# Patient Record
Sex: Male | Born: 1970
Health system: Southern US, Community
[De-identification: ages and names within clinical notes are randomized; demographics above are authoritative.]

## PROBLEM LIST (undated history)

## (undated) DIAGNOSIS — I10 Essential (primary) hypertension: Secondary | ICD-10-CM

## (undated) DIAGNOSIS — J069 Acute upper respiratory infection, unspecified: Secondary | ICD-10-CM

## (undated) DIAGNOSIS — R519 Headache, unspecified: Secondary | ICD-10-CM

## (undated) HISTORY — DX: Acute upper respiratory infection, unspecified: J06.9

## (undated) HISTORY — DX: Headache, unspecified: R51.9

## (undated) HISTORY — DX: Essential (primary) hypertension: I10

---

## 2001-11-02 HISTORY — PX: BACK SURGERY: SHX140

## 2006-11-12 ENCOUNTER — Emergency Department (HOSPITAL_COMMUNITY): Admission: EM | Admit: 2006-11-12 | Discharge: 2006-11-12 | Payer: Self-pay | Admitting: Emergency Medicine

## 2011-05-24 ENCOUNTER — Emergency Department (HOSPITAL_COMMUNITY): Payer: 59

## 2011-05-24 ENCOUNTER — Encounter: Payer: Self-pay | Admitting: *Deleted

## 2011-05-24 ENCOUNTER — Emergency Department (HOSPITAL_COMMUNITY)
Admission: EM | Admit: 2011-05-24 | Discharge: 2011-05-24 | Disposition: A | Discharge status: Home / Self Care | Payer: 59 | Attending: Emergency Medicine | Admitting: Emergency Medicine

## 2011-05-24 DIAGNOSIS — M25579 Pain in unspecified ankle and joints of unspecified foot: Secondary | ICD-10-CM | POA: Insufficient documentation

## 2011-05-24 DIAGNOSIS — F17200 Nicotine dependence, unspecified, uncomplicated: Secondary | ICD-10-CM | POA: Insufficient documentation

## 2011-05-24 DIAGNOSIS — M25571 Pain in right ankle and joints of right foot: Secondary | ICD-10-CM

## 2011-05-24 MED ORDER — HYDROCODONE-ACETAMINOPHEN 5-325 MG PO TABS
ORAL_TABLET | ORAL | Status: DC
Start: 2011-05-24 — End: 2011-05-26

## 2011-05-24 MED ORDER — IBUPROFEN 800 MG PO TABS
800.0000 mg | ORAL_TABLET | Freq: Once | ORAL | Status: AC
  Administered 2011-05-24: 800 mg via ORAL
  Filled 2011-05-24: qty 1

## 2011-05-24 MED ORDER — HYDROCODONE-ACETAMINOPHEN 5-325 MG PO TABS
1.0000 | ORAL_TABLET | Freq: Once | ORAL | Status: AC
  Administered 2011-05-24: 1 via ORAL
  Filled 2011-05-24: qty 1

## 2011-05-24 NOTE — ED Notes (Signed)
Pt states has cysts on right ankle.  Yesterday pt states pain became unbearable, states feels like someone poured gas on my foot.  Bruising/deformity noted to right ankle.  Denies new injury.  Applied heat/ice with elevation without relief.

## 2011-05-24 NOTE — ED Notes (Signed)
Pt ambulated with steady gate to POV. Wife to transport home.

## 2011-05-24 NOTE — ED Provider Notes (Signed)
History     Chief Complaint  Patient presents with  . Ankle Pain   Patient is a 40 y.o. male presenting with ankle pain. The history is provided by the patient. No language interpreter was used.  Ankle Pain  The incident occurred yesterday (pain since yest but has had "cysts" on ankle for 8-9 years.Marland Kitchen  dx per PCP in [place].  no known injury.). There was no injury mechanism. The pain is present in the right ankle. The quality of the pain is described as aching. The pain is moderate. He reports no foreign bodies present. The symptoms are aggravated by bearing weight and palpation. He has tried NSAIDs for the symptoms. The treatment provided no relief.    History reviewed. No pertinent past medical history.  History reviewed. No pertinent past surgical history.  No family history on file.  History  Substance Use Topics  . Smoking status: Current Everyday Smoker    Types: Cigarettes  . Smokeless tobacco: Not on file  . Alcohol Use: Yes     occasaional      Review of Systems  Musculoskeletal: Positive for gait problem.       Ankle pain  All other systems reviewed and are negative.    Physical Exam  BP 134/75  Pulse 102  Temp(Src) 99.4 F (37.4 C) (Oral)  Resp 16  Ht 6\' 2"  (1.88 m)  Wt 205 lb (92.987 kg)  BMI 26.32 kg/m2  SpO2 100%  Physical Exam  Nursing note and vitals reviewed. Constitutional: He is oriented to person, place, and time. Vital signs are normal. He appears well-developed and well-nourished.  HENT:  Head: Normocephalic and atraumatic.  Right Ear: External ear normal.  Left Ear: External ear normal.  Nose: Nose normal.  Mouth/Throat: No oropharyngeal exudate.  Eyes: Conjunctivae and EOM are normal. Pupils are equal, round, and reactive to light. Right eye exhibits no discharge. Left eye exhibits no discharge. No scleral icterus.  Neck: Normal range of motion. Neck supple. No JVD present. No tracheal deviation present. No thyromegaly present.    Cardiovascular: Normal rate, regular rhythm, normal heart sounds, intact distal pulses and normal pulses.  Exam reveals no gallop and no friction rub.   No murmur heard. Pulmonary/Chest: Effort normal and breath sounds normal. No stridor. No respiratory distress. He has no wheezes. He has no rales. He exhibits no tenderness.  Abdominal: Soft. Normal appearance and bowel sounds are normal. He exhibits no distension and no mass. There is no tenderness. There is no rebound and no guarding.  Musculoskeletal: He exhibits tenderness. He exhibits no edema.       Feet:  Lymphadenopathy:    He has no cervical adenopathy.  Neurological: He is alert and oriented to person, place, and time. He has normal reflexes. No cranial nerve deficit. Coordination normal. GCS eye subscore is 4. GCS verbal subscore is 5. GCS motor subscore is 6.  Reflex Scores:      Tricep reflexes are 2+ on the right side and 2+ on the left side.      Bicep reflexes are 2+ on the right side and 2+ on the left side.      Brachioradialis reflexes are 2+ on the right side and 2+ on the left side.      Patellar reflexes are 2+ on the right side and 2+ on the left side.      Achilles reflexes are 2+ on the right side and 2+ on the left side. Skin: Skin is warm  and dry. No rash noted. He is not diaphoretic.  Psychiatric: He has a normal mood and affect. His speech is normal and behavior is normal. Judgment and thought content normal. Cognition and memory are normal.    ED Course  Procedures  MDM       Worthy Rancher, PA 05/24/11 2036

## 2011-05-24 NOTE — ED Notes (Signed)
Pt a/ox4. Resp even and unlabored. NAD at this time. D/C instructions reviewed with pt and wife. Both verbalized understanidng.

## 2011-05-25 NOTE — ED Provider Notes (Signed)
Evaluation and management procedures were performed by the mid-level provider (PA/NP/CNM) under my supervision/collaboration.   Vida Roller, MD 05/25/11 [phone]

## 2011-05-26 ENCOUNTER — Encounter: Payer: Self-pay | Admitting: Orthopedic Surgery

## 2011-05-26 ENCOUNTER — Ambulatory Visit (INDEPENDENT_AMBULATORY_CARE_PROVIDER_SITE_OTHER): Payer: 59 | Admitting: Orthopedic Surgery

## 2011-05-26 VITALS — Resp 16 | Ht 74.0 in | Wt 205.0 lb

## 2011-05-26 DIAGNOSIS — M25579 Pain in unspecified ankle and joints of unspecified foot: Secondary | ICD-10-CM

## 2011-05-26 MED ORDER — HYDROCODONE-ACETAMINOPHEN 5-325 MG PO TABS: ORAL_TABLET | ORAL | Status: DC

## 2011-05-26 MED ORDER — IBUPROFEN 800 MG PO TABS: 800.0000 mg | ORAL_TABLET | Freq: Three times a day (TID) | ORAL | Status: AC | PRN

## 2011-05-26 NOTE — Progress Notes (Signed)
   New patient  Referral from the emergency room.  Chief complaint is pain, RIGHT ankle and foot.  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 He went to the emergency room complaining of sharp throbbing, burning, pain, 8-10/10, which is constant unremitting, associated with tingling, swelling, darkening of the skin over the lateral ankle and unrelieved by Norco 5 mg and 400 mg of of ibuprofen every 6-8 hours. Also, nonweightbearing with crutches still having constant pain.  He denies any trauma, no history of gout.  Review of Systems all were reported as negative.  Vital signs are stable as recorded  General appearance is normal  The patient is alert and oriented x3  The patient's mood and affect are normal  Gait assessment: crutches The cardiovascular exam reveals normal pulses and temperature without edema swelling.  The lymphatic system is negative for palpable lymph nodes  The sensory exam is normal.  There are no pathologic reflexes.  Balance is normal.   Exam of the right foot Inspection tenderness in the lateral ankle with swelling of the lateral ankle  Range of motion is PF = 20 and DF = 10  Stability normal  Strength N/T  Skin dark over 2 areas of prior skin callouses   Negative. xrays from the hospital   Ventral diagnosis gout, synovitis, rheumatoid arthritis.  Recommend lab series CBC, C-reactive protein, sedimentation rate uric acid. Continue nonweightbearing.

## 2011-05-26 NOTE — Patient Instructions (Signed)
Take Ibuprofen 800mg   1 tablet by mouth every 8 hrs  Take pain medicine every 6 hrs as needed  Go for labs today  No weightbearing  Come back in a week

## 2011-05-27 LAB — CBC WITH DIFFERENTIAL/PLATELET
Eosinophils Relative: 6 % — ABNORMAL HIGH (ref 0–5)
HCT: 46.8 % (ref 39.0–52.0)
Lymphocytes Relative: 35 % (ref 12–46)
Lymphs Abs: 2.7 10*3/uL (ref 0.7–4.0)
MCV: 91.6 fL (ref 78.0–100.0)
Monocytes Absolute: 0.5 10*3/uL (ref 0.1–1.0)
Monocytes Relative: 7 % (ref 3–12)
RBC: 5.11 MIL/uL (ref 4.22–5.81)
WBC: 7.7 10*3/uL (ref 4.0–10.5)

## 2011-05-28 ENCOUNTER — Ambulatory Visit: Payer: 59 | Admitting: Orthopedic Surgery

## 2011-05-28 ENCOUNTER — Telehealth: Payer: Self-pay | Admitting: *Deleted

## 2011-05-28 NOTE — Telephone Encounter (Signed)
Called patient and advised labs were normal keep scheduled appt no weightbearing, RICE and meds.

## 2011-06-02 ENCOUNTER — Ambulatory Visit: Payer: 59 | Admitting: Orthopedic Surgery

## 2011-11-17 ENCOUNTER — Ambulatory Visit (INDEPENDENT_AMBULATORY_CARE_PROVIDER_SITE_OTHER): Payer: 59 | Admitting: Cardiology

## 2011-11-17 ENCOUNTER — Encounter: Payer: Self-pay | Admitting: Cardiology

## 2011-11-17 ENCOUNTER — Encounter: Payer: Self-pay | Admitting: *Deleted

## 2011-11-17 ENCOUNTER — Other Ambulatory Visit: Payer: Self-pay | Admitting: Cardiology

## 2011-11-17 VITALS — BP 132/89 | HR 70 | Ht 74.0 in | Wt 207.0 lb

## 2011-11-17 DIAGNOSIS — F17200 Nicotine dependence, unspecified, uncomplicated: Secondary | ICD-10-CM

## 2011-11-17 DIAGNOSIS — Z01812 Encounter for preprocedural laboratory examination: Secondary | ICD-10-CM

## 2011-11-17 DIAGNOSIS — IMO0001 Reserved for inherently not codable concepts without codable children: Secondary | ICD-10-CM | POA: Insufficient documentation

## 2011-11-17 DIAGNOSIS — R030 Elevated blood-pressure reading, without diagnosis of hypertension: Secondary | ICD-10-CM

## 2011-11-17 DIAGNOSIS — I200 Unstable angina: Secondary | ICD-10-CM

## 2011-11-17 DIAGNOSIS — Z720 Tobacco use: Secondary | ICD-10-CM | POA: Insufficient documentation

## 2011-11-17 DIAGNOSIS — Z8249 Family history of ischemic heart disease and other diseases of the circulatory system: Secondary | ICD-10-CM

## 2011-11-17 DIAGNOSIS — R072 Precordial pain: Secondary | ICD-10-CM | POA: Insufficient documentation

## 2011-11-17 NOTE — Progress Notes (Signed)
   Clinical Summary Mr. Crandall is a 41 y.o.male referred by Dr. Burdine for evaluation of chest pain. He reports at least a two-month history of left arm discomfort described as a dull ache radiating from shoulder to wrist, sporadic, usually notices it after the end of a busy day. Within the last few weeks he has been experiencing more frank chest discomfort, initially sporadic, and now more specifically with exertion. He describes a "pressure" and also a vague dullness that never seems to abate.  Recent ED visit at [facility] on 1/1 with same symptoms noted studies showing Hgb 15.8, platelets 228, creatinine 0.8, potassium 3.5, troponin I normal, CKMB normal, chest x-ray without acute process. He has been seen in the office by Dr. Kirk Burdine subsequently, and there was discussion about perhaps pursuing stress testing.  He reports that his symptoms are progressing, and he remains very concerned about the status of his heart, in light of significant family history of premature CAD. He also states his father had a thoracic aneurysm in addition to CAD. He developed heart problems in his 39s.  He reports no major medical conditions or hospitalizations. Blood pressure has been elevated on the last few visits that I was able to review, however he has no long-standing history of hypertension. Lipid status uncertain.  Today we discussed options for further cardiac evaluation including both noninvasive and invasive techniques. After a thorough review of the options including risks and benefits, he is in agreement to proceed with a diagnostic cardiac catheterization to assess coronary anatomy, and also an aortic root injection to exclude aneurysmal change.   No Known Allergies  Current Outpatient Prescriptions  Medication Sig Dispense Refill  . ibuprofen (ADVIL,MOTRIN) 200 MG tablet Take 200 mg by mouth every 6 (six) hours as needed. 2 tablets as needed         No past medical history on file.  Past  Surgical History  Procedure Date  . Back surgery 2003    Family History  Problem Relation Age of Onset  . Heart disease Maternal Grandfather 70    Deceased  . Diabetes    . Heart disease Paternal Grandmother 70    Deceased  . Heart attack Father 45    Deceased  . Hypertension Sister     Alive  . Aneurysm Father     Social History Mr. Caffey reports that he has been smoking Cigarettes.  He does not have any smokeless tobacco history on file. Mr. Rease reports that he drinks alcohol.  Review of Systems No palpitations or syncope. No claudication. No cough, fevers or chills. No reported bleeding problems. Appetite stable. Otherwise negative.  Physical Examination Filed Vitals:   11/17/11 1019  BP: 132/89  Pulse: 70   Well-developed male in no acute distress. HEENT: Conjunctiva and lids normal, oropharynx clear with moist mucosa. Neck: Supple, no elevated JVP or carotid bruits, no thyromegaly. Lungs: Clear to auscultation, nonlabored breathing at rest. Cardiac: Regular rate and rhythm, no S3 or significant systolic murmur, no pericardial rub. Abdomen: Soft, nontender, bowel sounds present, no guarding or rebound. Extremities: No pitting edema, distal pulses 2+. Skin: Warm and dry. Musculoskeletal: No kyphosis. Neuropsychiatric: Alert and oriented x3, affect grossly appropriate.  ECG Normal sinus rhythm.   Problem List and Plan   

## 2011-11-17 NOTE — Progress Notes (Signed)
Addended by: Reather Laurence A on: 11/17/2011 11:15 AM   Modules accepted: Orders

## 2011-11-17 NOTE — Assessment & Plan Note (Signed)
As noted above, requesting aortic root injection with cardiac catheterization.

## 2011-11-17 NOTE — Assessment & Plan Note (Signed)
We discussed smoking cessation today. He states he was able to quit for 3 months last year. He seems to be reasonably motivated for another attempt.

## 2011-11-17 NOTE — Assessment & Plan Note (Signed)
Noted today, as well as at ER visit. We discussed this, and I have recommended closer followup for blood pressure. He will need regular follow up with Dr. Leandrew Koyanagi, and possibly medical therapy, particularly if his cardiac catheterization results are abnormal.

## 2011-11-17 NOTE — Assessment & Plan Note (Signed)
Chest pain symptoms that are concerning for unstable angina, however with both typical and atypical features, noted within the last few weeks, preceded by left arm discomfort. Resting ECG is normal. Principal risk includes gender and significant family history of premature CAD. He is also noted to be hypertensive with no standing history of same. Lipid status uncertain at this point. In light of his progressive symptoms, and need for a clear diagnosis, we have reviewed the risks and benefits of diagnostic cardiac catheterization. This is being arranged as an outpatient. He will be initiated on aspirin, may require further therapies depending on the results. Can also assess lipid status along the way. I am also requesting an aortic root injection to exclude possible aortic aneurysmal disease in light of his father's history and chest pain symptoms.

## 2011-11-17 NOTE — Patient Instructions (Signed)
Your physician recommends that you schedule a follow-up appointment in: 3 months  Your physician has recommended you make the following change in your medication:  START Aspirin 81 mg daily  Your physician has requested that you have a cardiac catheterization. Cardiac catheterization is used to diagnose and/or treat various heart conditions. Doctors may recommend this procedure for a number of different reasons. The most common reason is to evaluate chest pain. Chest pain can be a symptom of coronary artery disease (CAD), and cardiac catheterization can show whether plaque is narrowing or blocking your heart's arteries. This procedure is also used to evaluate the valves, as well as measure the blood flow and oxygen levels in different parts of your heart. For further information please visit [email or web address] Please follow instruction sheet, as given.  Your physician recommends that you return for lab work in: Today

## 2011-11-18 LAB — CBC
HCT: 48.2 % (ref 39.0–52.0)
MCV: 93.8 fL (ref 78.0–100.0)
Platelets: 239 10*3/uL (ref 150–400)
RBC: 5.14 MIL/uL (ref 4.22–5.81)
WBC: 7.2 10*3/uL (ref 4.0–10.5)

## 2011-11-18 LAB — COMPREHENSIVE METABOLIC PANEL
ALT: 15 U/L (ref 0–53)
CO2: 28 mEq/L (ref 19–32)
Calcium: 10 mg/dL (ref 8.4–10.5)
Chloride: 105 mEq/L (ref 96–112)
Creat: 1 mg/dL (ref 0.50–1.35)

## 2011-11-20 ENCOUNTER — Inpatient Hospital Stay (HOSPITAL_BASED_OUTPATIENT_CLINIC_OR_DEPARTMENT_OTHER)
Admission: RE | Admit: 2011-11-20 | Discharge: 2011-11-20 | Disposition: A | Discharge status: Home / Self Care | Payer: 59 | Source: Ambulatory Visit | Attending: Cardiovascular Disease | Admitting: Cardiovascular Disease

## 2011-11-20 ENCOUNTER — Inpatient Hospital Stay (HOSPITAL_BASED_OUTPATIENT_CLINIC_OR_DEPARTMENT_OTHER): Admission: RE | Admit: 2011-11-20 | Payer: 59 | Source: Ambulatory Visit | Admitting: Cardiovascular Disease

## 2011-11-20 ENCOUNTER — Inpatient Hospital Stay (HOSPITAL_BASED_OUTPATIENT_CLINIC_OR_DEPARTMENT_OTHER): Admit: 2011-11-20 | Payer: Self-pay | Admitting: Cardiovascular Disease

## 2011-11-20 ENCOUNTER — Encounter (HOSPITAL_BASED_OUTPATIENT_CLINIC_OR_DEPARTMENT_OTHER): Admission: RE | Payer: Self-pay | Source: Ambulatory Visit

## 2011-11-20 ENCOUNTER — Encounter (HOSPITAL_BASED_OUTPATIENT_CLINIC_OR_DEPARTMENT_OTHER)
Admission: RE | Disposition: A | Discharge status: Home / Self Care | Payer: Self-pay | Source: Ambulatory Visit | Attending: Cardiovascular Disease

## 2011-11-20 ENCOUNTER — Encounter (HOSPITAL_BASED_OUTPATIENT_CLINIC_OR_DEPARTMENT_OTHER): Payer: Self-pay

## 2011-11-20 DIAGNOSIS — Z8249 Family history of ischemic heart disease and other diseases of the circulatory system: Secondary | ICD-10-CM | POA: Insufficient documentation

## 2011-11-20 DIAGNOSIS — R0789 Other chest pain: Secondary | ICD-10-CM | POA: Insufficient documentation

## 2011-11-20 DIAGNOSIS — I200 Unstable angina: Secondary | ICD-10-CM

## 2011-11-20 DIAGNOSIS — R079 Chest pain, unspecified: Secondary | ICD-10-CM

## 2011-11-20 SURGERY — JV LEFT HEART CATHETERIZATION WITH CORONARY ANGIOGRAM
Anesthesia: Moderate Sedation

## 2011-11-20 MED ORDER — SODIUM CHLORIDE 0.9 % IV SOLN: INTRAVENOUS | Status: AC

## 2011-11-20 MED ORDER — ONDANSETRON HCL 4 MG/2ML IJ SOLN: 4.0000 mg | Freq: Four times a day (QID) | INTRAMUSCULAR | Status: DC | PRN

## 2011-11-20 MED ORDER — ACETAMINOPHEN 325 MG PO TABS: 650.0000 mg | ORAL_TABLET | ORAL | Status: DC | PRN

## 2011-11-20 MED ORDER — ROSUVASTATIN CALCIUM 40 MG PO TABS: 40.0000 mg | ORAL_TABLET | ORAL | Status: DC

## 2011-11-20 MED ORDER — ASPIRIN 81 MG PO CHEW
324.0000 mg | CHEWABLE_TABLET | ORAL | Status: AC
  Administered 2011-11-20: 324 mg via ORAL

## 2011-11-20 MED ORDER — DIAZEPAM 5 MG PO TABS
5.0000 mg | ORAL_TABLET | ORAL | Status: AC
  Administered 2011-11-20: 5 mg via ORAL

## 2011-11-20 MED ORDER — SODIUM CHLORIDE 0.9 % IV SOLN
INTRAVENOUS | Status: DC
  Administered 2011-11-20: 07:00:00 via INTRAVENOUS

## 2011-11-20 MED ORDER — ROSUVASTATIN CALCIUM 40 MG PO TABS: 40.0000 mg | ORAL_TABLET | Freq: Every day | ORAL | Status: DC

## 2011-11-20 NOTE — OR Nursing (Signed)
Tegaderm dressing applied, site level 0, bedrest begins at 0835 

## 2011-11-20 NOTE — Interval H&P Note (Signed)
History and Physical Interval Note:  11/20/2011 7:26 AM  John Matthews  has presented today for cardiac cath.  with the diagnosis of cp  The various methods of treatment have been discussed with the patient and family. After consideration of risks, benefits and other options for treatment, the patient has consented to  Procedure(s): JV LEFT HEART CATHETERIZATION WITH CORONARY ANGIOGRAM as a surgical intervention .  The patients' history has been reviewed, patient examined, no change in status, stable for surgery.  I have reviewed the patients' chart and labs.  Questions were answered to the patient's satisfaction.     Kaleia Longhi,[place]

## 2011-11-20 NOTE — Op Note (Signed)
Cardiac Catheterization Operative Report  John Matthews [id number] 1/18/20138:16 AM Juliette Alcide, MD, MD  Procedure Performed:  1. Left Heart Catheterization 2. Selective Coronary Angiography 3. Left ventricular angiogram 4. Aortic root angiogram  Operator: Verne Carrow, MD  Indication:   Chest pain   Referring: Nona Dell, MD                                    Procedure Details: The risks, benefits, complications, treatment options, and expected outcomes were discussed with the patient. The patient and/or family concurred with the proposed plan, giving informed consent. The patient was brought to the cath lab after IV hydration was begun and oral premedication was given. The patient was further sedated with Versed and Fentanyl. The right groin was prepped and draped in the usual manner. Using the modified Seldinger access technique, a 4 French sheath was placed in the right femoral artery. Standard diagnostic catheters were used to perform selective coronary angiography. A pigtail catheter was used to perform a left ventricular angiogram and an aortic root angiogram.   There were no immediate complications. The patient was taken to the recovery area in stable condition.   Hemodynamic Findings: Central aortic pressure: 81/60 Left ventricular pressure: 81/8/10  Angiographic Findings:  Left main: No evidence of disease.   Left Anterior Descending Artery: No angiographic evidence of disease. Large vessel that courses to the apex.   Circumflex Artery: No angiographic evidence of disease. Large vessel with moderate sized OM branch.   Right Coronary Artery: Large dominant vessel with no angiographic evidence of disease.   Left Ventricular Angiogram:Normal LV systolic function, LVEF 60%.   Aortic Root Angiogram: No dilatation.    Impression: 1. No angiographic evidence of CAD 2. Normal LV systolic function. 3. Non-cardiac etiology of chest  pain  Recommendations: No further cardiac workup.        Complications:  None. The patient tolerated the procedure well.

## 2011-11-20 NOTE — H&P (View-Only) (Signed)
   Clinical Summary John Matthews is a 42 y.o.male referred by Dr. Leandrew Koyanagi for evaluation of chest pain. He reports at least a two-month history of left arm discomfort described as a dull ache radiating from shoulder to wrist, sporadic, usually notices it after the end of a busy day. Within the last few weeks he has been experiencing more frank chest discomfort, initially sporadic, and now more specifically with exertion. He describes a "pressure" and also a vague dullness that never seems to abate.  Recent ED visit at [facility] on 1/1 with same symptoms noted studies showing Hgb 15.8, platelets 228, creatinine 0.8, potassium 3.5, troponin I normal, CKMB normal, chest x-ray without acute process. He has been seen in the office by Dr. Clydene Pugh subsequently, and there was discussion about perhaps pursuing stress testing.  He reports that his symptoms are progressing, and he remains very concerned about the status of his heart, in light of significant family history of premature CAD. He also states his father had a thoracic aneurysm in addition to CAD. He developed heart problems in his mid 47s.  He reports no major medical conditions or hospitalizations. Blood pressure has been elevated on the last few visits that I was able to review, however he has no long-standing history of hypertension. Lipid status uncertain.  Today we discussed options for further cardiac evaluation including both noninvasive and invasive techniques. After a thorough review of the options including risks and benefits, he is in agreement to proceed with a diagnostic cardiac catheterization to assess coronary anatomy, and also an aortic root injection to exclude aneurysmal change.   No Known Allergies  Current Outpatient Prescriptions  Medication Sig Dispense Refill  . ibuprofen (ADVIL,MOTRIN) 200 MG tablet Take 200 mg by mouth every 6 (six) hours as needed. 2 tablets as needed         No past medical history on file.  Past  Surgical History  Procedure Date  . Back surgery 2003    Family History  Problem Relation Age of Onset  . Heart disease Maternal Grandfather 24    Deceased  . Diabetes    . Heart disease Paternal Grandmother 14    Deceased  . Heart attack Father 81    Deceased  . Hypertension Sister     Alive  . Aneurysm Father     Social History John Matthews reports that he has been smoking Cigarettes.  He does not have any smokeless tobacco history on file. John Matthews reports that he drinks alcohol.  Review of Systems No palpitations or syncope. No claudication. No cough, fevers or chills. No reported bleeding problems. Appetite stable. Otherwise negative.  Physical Examination Filed Vitals:   11/17/11 1019  BP: 132/89  Pulse: 70   Well-developed male in no acute distress. HEENT: Conjunctiva and lids normal, oropharynx clear with moist mucosa. Neck: Supple, no elevated JVP or carotid bruits, no thyromegaly. Lungs: Clear to auscultation, nonlabored breathing at rest. Cardiac: Regular rate and rhythm, no S3 or significant systolic murmur, no pericardial rub. Abdomen: Soft, nontender, bowel sounds present, no guarding or rebound. Extremities: No pitting edema, distal pulses 2+. Skin: Warm and dry. Musculoskeletal: No kyphosis. Neuropsychiatric: Alert and oriented x3, affect grossly appropriate.  ECG Normal sinus rhythm.   Problem List and Plan

## 2011-11-20 NOTE — OR Nursing (Signed)
Meal offered, pt declined

## 2011-11-20 NOTE — OR Nursing (Signed)
Discharge instructions reviewed and signed, pt stated understanding, ambulated in hall without difficulty, site level 0, transported to wife's car via wheelchair 

## 2011-12-09 ENCOUNTER — Ambulatory Visit: Payer: 59 | Admitting: Cardiology

## 2011-12-10 ENCOUNTER — Encounter: Payer: Self-pay | Admitting: Cardiology

## 2011-12-10 ENCOUNTER — Ambulatory Visit (INDEPENDENT_AMBULATORY_CARE_PROVIDER_SITE_OTHER): Payer: 59 | Admitting: Cardiology

## 2011-12-10 VITALS — BP 133/85 | HR 86 | Ht 74.0 in | Wt 207.0 lb

## 2011-12-10 DIAGNOSIS — R072 Precordial pain: Secondary | ICD-10-CM

## 2011-12-10 DIAGNOSIS — R030 Elevated blood-pressure reading, without diagnosis of hypertension: Secondary | ICD-10-CM

## 2011-12-10 DIAGNOSIS — IMO0001 Reserved for inherently not codable concepts without codable children: Secondary | ICD-10-CM

## 2011-12-10 NOTE — Progress Notes (Signed)
   Clinical Summary John Matthews is a 41 y.o.male presenting for followup. He was referred for diagnostic cardiac catheterization following his initial visit secondary to chest pain symptoms and significant family history of premature cardiovascular disease as well as thoracic aortic aneurysm in his father.  Fortunately, he had no evidence of coronary artery disease with normal aortic arch. I reviewed these results with him today.  He continues to report a fairly constant feeling of chest discomfort, seems to now feel that it corresponds with psychosocial stressors in his life. I discussed this with him today. He was however reassured by the findings of his recent angiogram.  We discussed basic risk factor modification strategies going forward, stress reduction, and plan for him to follow up with his primary care provider.   No Known Allergies  Current Outpatient Prescriptions  Medication Sig Dispense Refill  . ibuprofen (ADVIL,MOTRIN) 200 MG tablet Take 200 mg by mouth every 6 (six) hours as needed. 2 tablets as needed         Social History John Matthews reports that he has been smoking Cigarettes.  He does not have any smokeless tobacco history on file. John Matthews reports that he drinks alcohol.  Review of Systems Otherwise negative except as outlined.  Physical Examination Filed Vitals:   12/10/11 1536  BP: 133/85  Pulse: 86   Well-developed male in no acute distress.  HEENT: Conjunctiva and lids normal, oropharynx clear with moist mucosa.  Neck: Supple, no elevated JVP or carotid bruits, no thyromegaly.  Lungs: Clear to auscultation, nonlabored breathing at rest.  Cardiac: Regular rate and rhythm, no S3 or significant systolic murmur, no pericardial rub.  Abdomen: Soft, nontender, bowel sounds present, no guarding or rebound.  Extremities: No pitting edema, distal pulses 2+.     Problem List and Plan

## 2011-12-10 NOTE — Assessment & Plan Note (Signed)
Recent cardiac catheterization was quite reassuring, no significant CAD with normal aortic arch. Patient was reassured. We did discuss stress reduction and basic risk factor modification. He will followup with his primary care physician.

## 2011-12-10 NOTE — Patient Instructions (Signed)
Your physician recommends that you continue on your current medications as directed. Please refer to the Current Medication list given to you today.  Your physician recommends that you schedule a follow-up appointment in: as needd

## 2011-12-10 NOTE — Assessment & Plan Note (Signed)
Recommended that he follow blood pressure carefully with his primary care physician, also diet and exercise.

## 2012-04-13 IMAGING — CR DG ANKLE COMPLETE 3+V*R*
3 series · 3 of 3 positions shown · non-contrast
Comparison: None.

CLINICAL DATA: Swelling and pain

RIGHT ANKLE - COMPLETE 3+ VIEW

[view not recorded (1 of 3)]
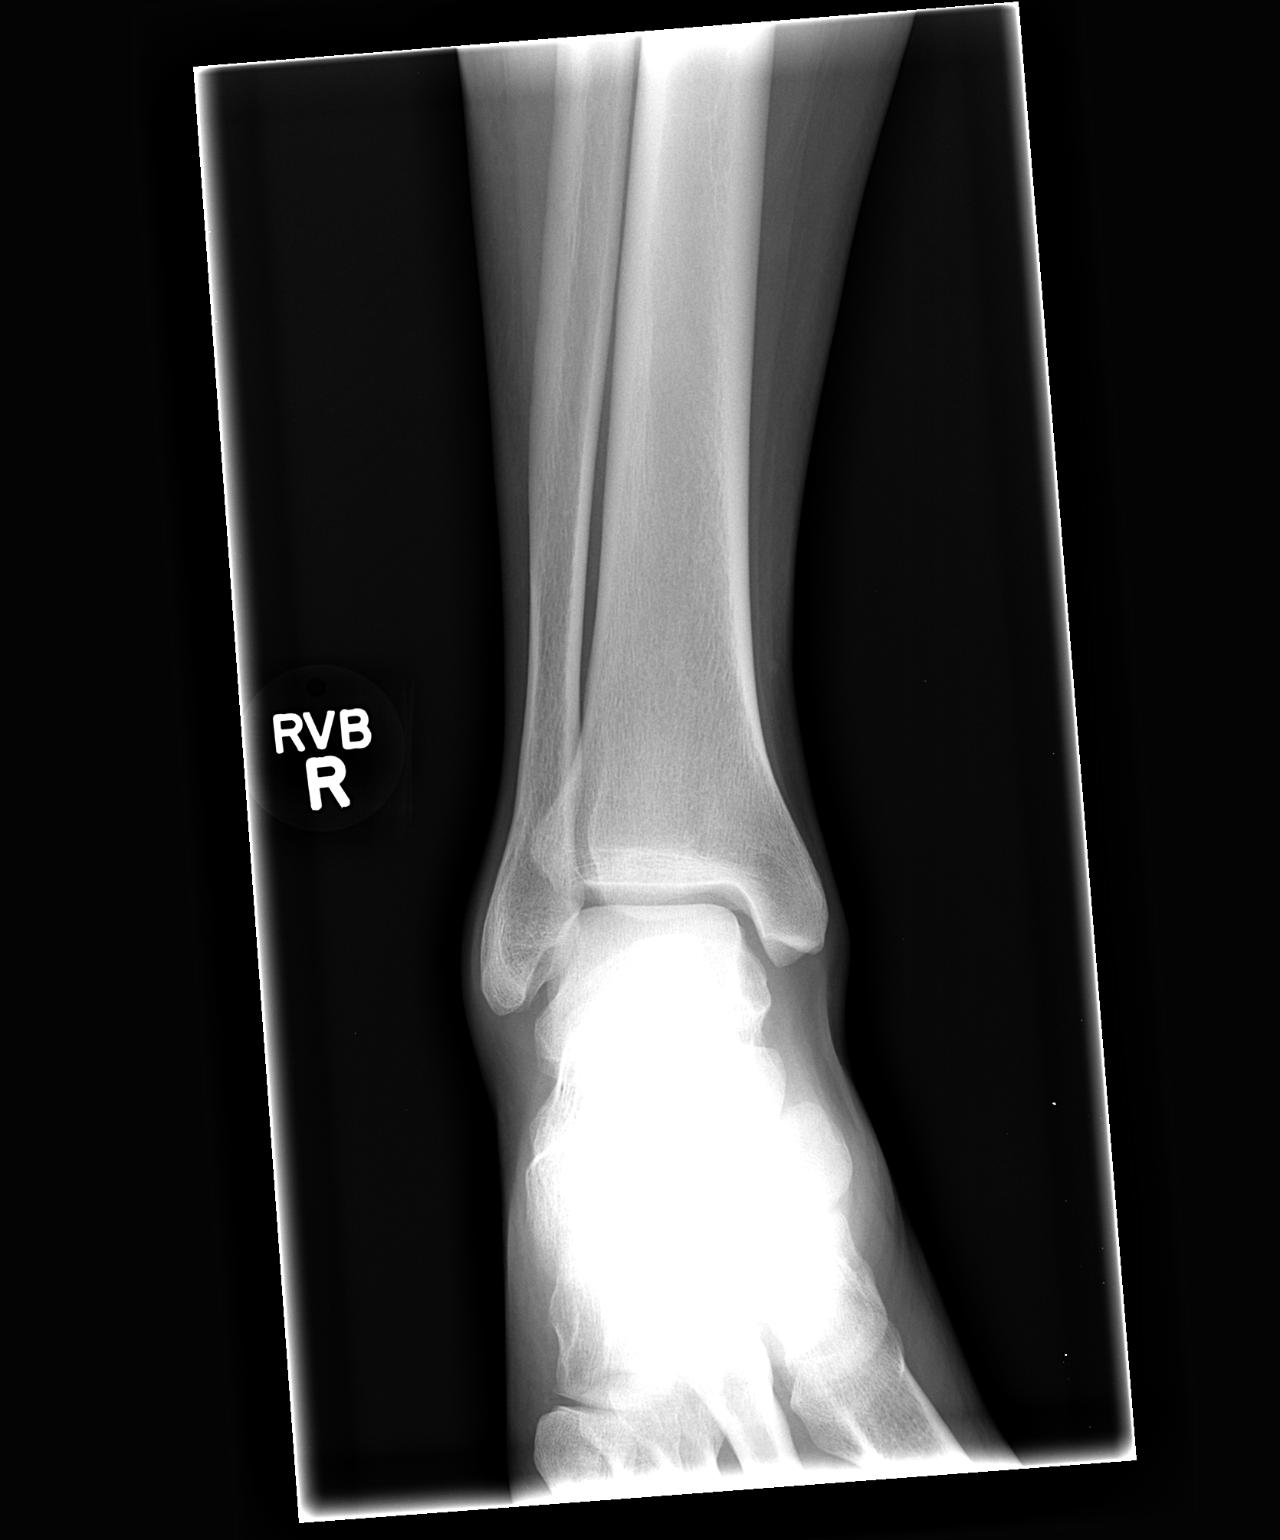

[view not recorded (2 of 3)]
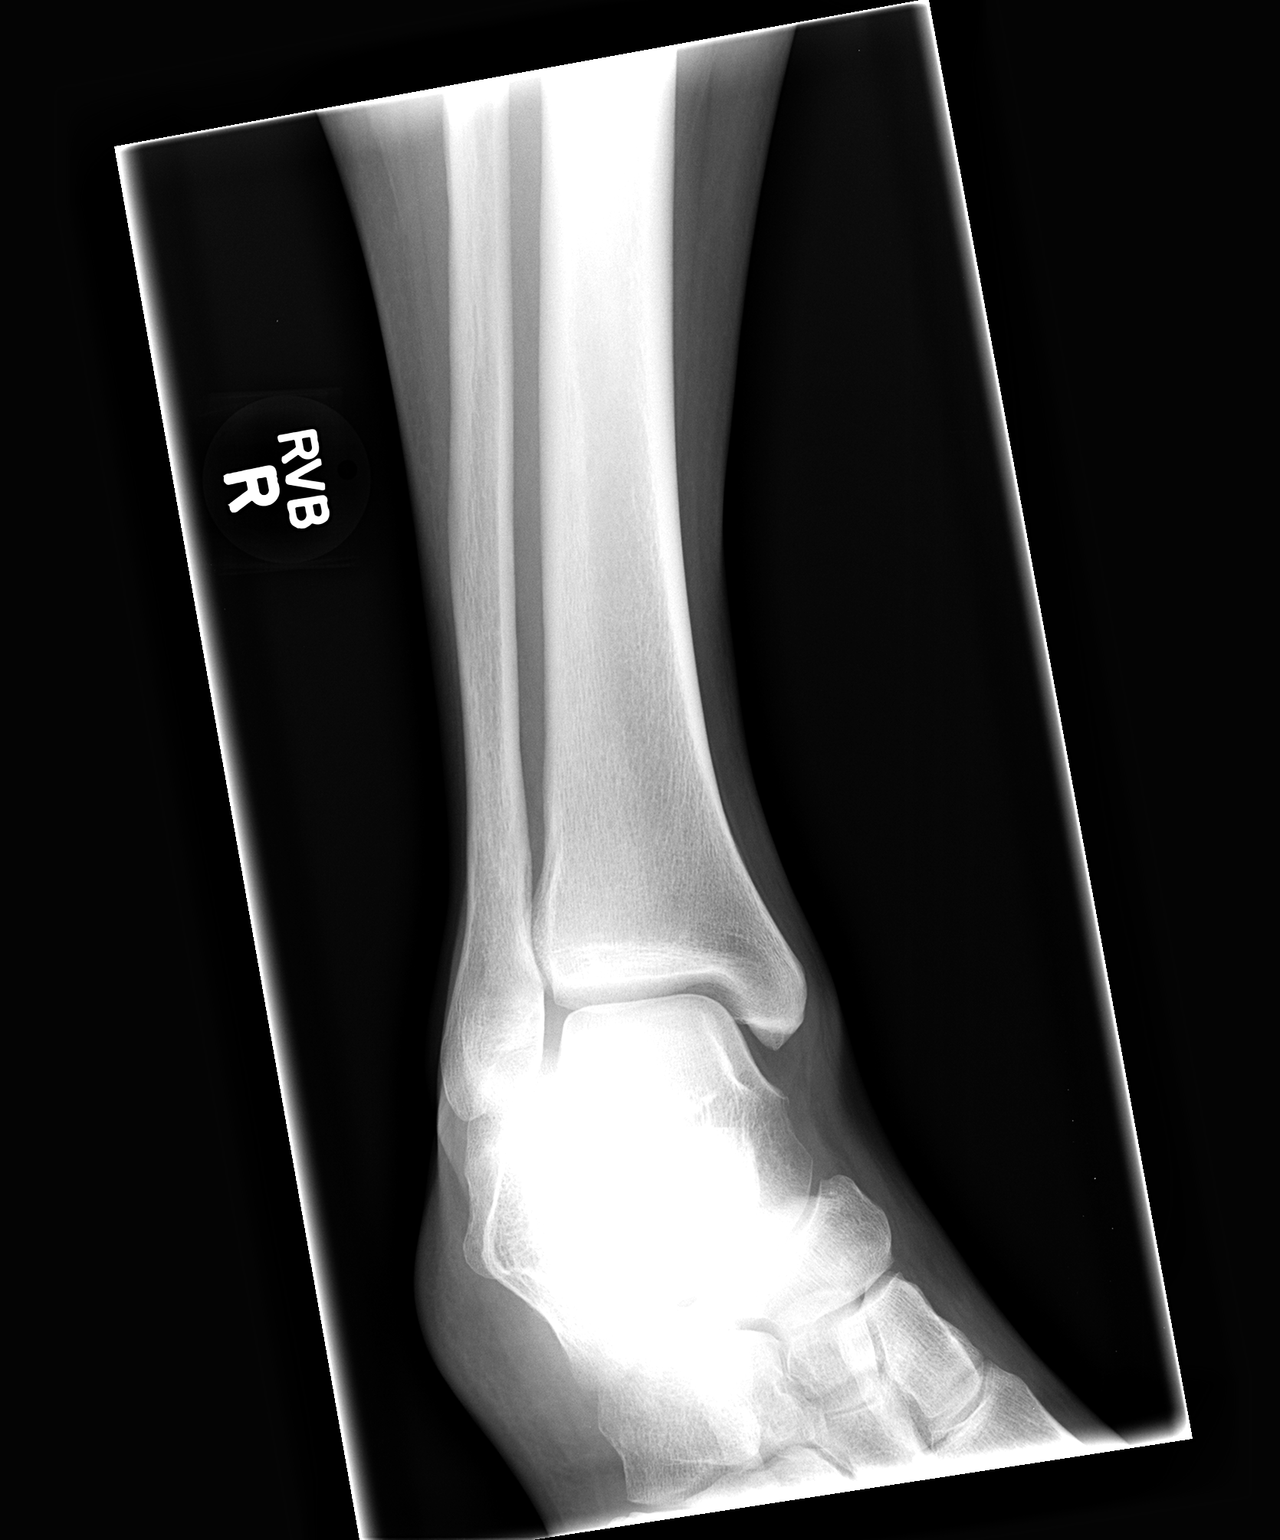

[view not recorded (3 of 3)]
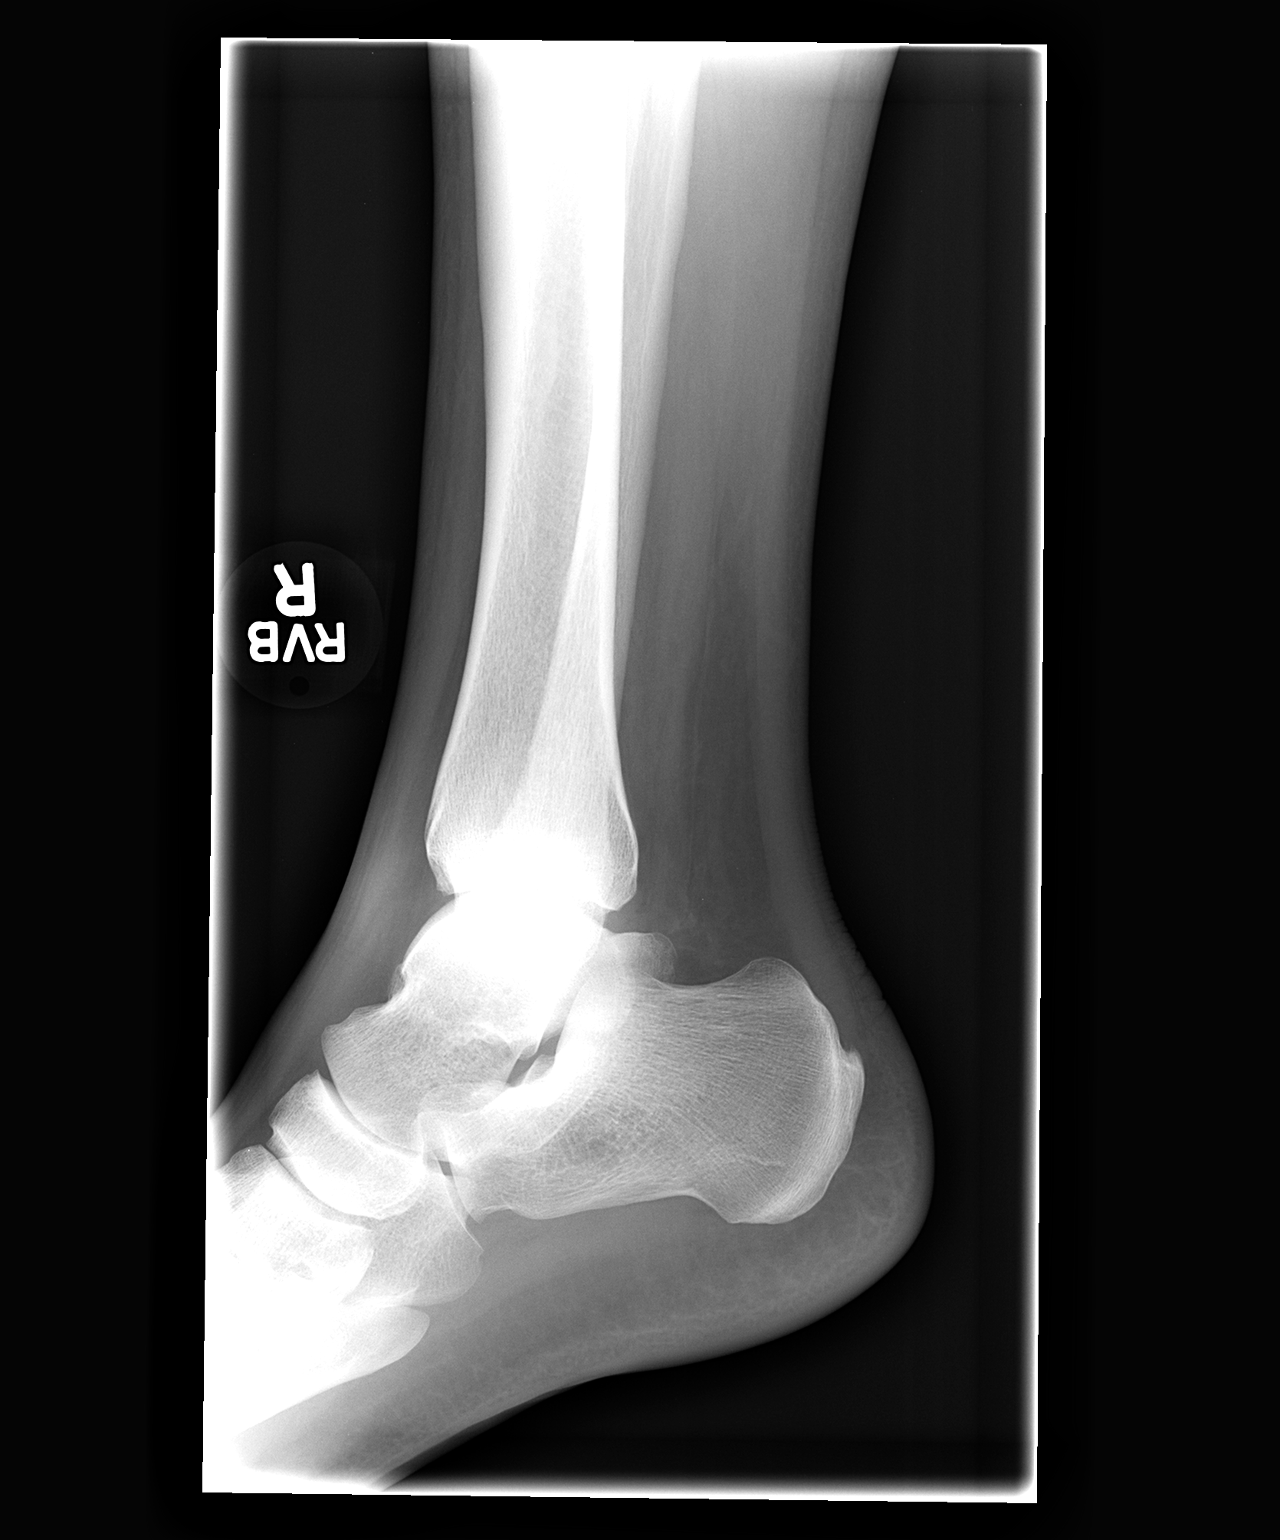

[3 of 3 positions shown; findings below may reference images not displayed]

FINDINGS: Ankle mortise intact. Negative for fracture, dislocation,
or other acute abnormality.  Normal alignment and mineralization.
No significant degenerative change.  Regional soft tissues
unremarkable.
IMPRESSION: Negative

## 2015-11-02 ENCOUNTER — Encounter (HOSPITAL_COMMUNITY): Payer: Self-pay | Admitting: Emergency Medicine

## 2015-11-02 ENCOUNTER — Emergency Department (HOSPITAL_COMMUNITY)
Admission: EM | Admit: 2015-11-02 | Discharge: 2015-11-02 | Disposition: A | Discharge status: Home / Self Care | Payer: 59 | Attending: Emergency Medicine | Admitting: Emergency Medicine

## 2015-11-02 DIAGNOSIS — R52 Pain, unspecified: Secondary | ICD-10-CM | POA: Diagnosis present

## 2015-11-02 DIAGNOSIS — J111 Influenza due to unidentified influenza virus with other respiratory manifestations: Secondary | ICD-10-CM | POA: Diagnosis not present

## 2015-11-02 DIAGNOSIS — F17210 Nicotine dependence, cigarettes, uncomplicated: Secondary | ICD-10-CM | POA: Insufficient documentation

## 2015-11-02 MED ORDER — HYDROCODONE-HOMATROPINE 5-1.5 MG/5ML PO SYRP: 5.0000 mL | ORAL_SOLUTION | Freq: Four times a day (QID) | ORAL | Status: DC | PRN

## 2015-11-02 MED ORDER — AMOXICILLIN 500 MG PO CAPS: 500.0000 mg | ORAL_CAPSULE | Freq: Three times a day (TID) | ORAL | Status: DC

## 2015-11-02 MED ORDER — LORATADINE-PSEUDOEPHEDRINE ER 5-120 MG PO TB12: 1.0000 | ORAL_TABLET | Freq: Two times a day (BID) | ORAL | Status: DC

## 2015-11-02 NOTE — ED Provider Notes (Signed)
CSN: [id number]     Arrival date & time 11/02/15  1554 History   First MD Initiated Contact with Patient 11/02/15 1607     Chief Complaint  Patient presents with  . Generalized Body Aches     (Consider location/radiation/quality/duration/timing/severity/associated sxs/prior Treatment) Patient is a 44 y.o. male presenting with URI. The history is provided by the patient.  URI Presenting symptoms: congestion, cough, fever, rhinorrhea and sore throat   Severity:  Moderate Onset quality:  Gradual Duration:  8 days Timing:  Intermittent Progression:  Worsening Chronicity:  New Relieved by:  Nothing Associated symptoms: headaches, sinus pain and sneezing   Risk factors: sick contacts   Risk factors: no diabetes mellitus, no immunosuppression and no recent travel     History reviewed. No pertinent past medical history. Past Surgical History  Procedure Laterality Date  . Back surgery  2003   Family History  Problem Relation Age of Onset  . Heart disease Maternal Grandfather 3870    Deceased  . Diabetes    . Heart disease Paternal Grandmother 5970    Deceased  . Heart attack Father 2545    Deceased  . Hypertension Sister     Alive  . Aneurysm Father    Social History  Substance Use Topics  . Smoking status: Current Every Day Smoker -- 1.00 packs/day    Types: Cigarettes  . Smokeless tobacco: None  . Alcohol Use: Yes     Comment: Occasional    Review of Systems  Constitutional: Positive for fever and appetite change.  HENT: Positive for congestion, rhinorrhea, sneezing and sore throat.   Respiratory: Positive for cough.   Neurological: Positive for headaches.  All other systems reviewed and are negative.     Allergies  Review of patient's allergies indicates no known allergies.  Home Medications   Prior to Admission medications   Medication Sig Start Date End Date Taking? Authorizing Provider  oxymetazoline (AFRIN) 0.05 % nasal spray Place 1 spray into both  nostrils 2 (two) times daily as needed for congestion.   Yes Historical Provider, MD  Phenyleph-CPM-DM-Aspirin (ALKA-SELTZER PLUS COLD & COUGH) 7.06-03-09-325 MG TBEF Take 2 tablets by mouth every 4 (four) hours as needed (Cold Symptoms).   Yes Historical Provider, MD   BP 149/91 mmHg  Pulse 108  Temp(Src) 98.6 F (37 C) (Oral)  Resp 14  SpO2 98% Physical Exam  Constitutional: He is oriented to person, place, and time. He appears well-developed and well-nourished.  Non-toxic appearance.  HENT:  Head: Normocephalic.  Right Ear: Tympanic membrane and external ear normal.  Left Ear: Tympanic membrane and external ear normal.  Nasal congestion present. Uvula enlarged. Increase redness of the posterior pharynx. Swelling of the right tonsil, no abscess noted.  Eyes: EOM and lids are normal. Pupils are equal, round, and reactive to light.  Neck: Normal range of motion. Neck supple. Carotid bruit is not present.  Cardiovascular: Normal rate, regular rhythm, normal heart sounds, intact distal pulses and normal pulses.   Pulmonary/Chest: Breath sounds normal. No respiratory distress.  Abdominal: Soft. Bowel sounds are normal. There is no tenderness. There is no guarding.  Musculoskeletal: Normal range of motion.  Lymphadenopathy:       Head (right side): No submandibular adenopathy present.       Head (left side): No submandibular adenopathy present.    He has no cervical adenopathy.  Neurological: He is alert and oriented to person, place, and time. He has normal strength. No cranial nerve deficit or  sensory deficit.  Skin: Skin is warm and dry. No rash noted.  Psychiatric: He has a normal mood and affect. His speech is normal.  Nursing note and vitals reviewed.   ED Course  Procedures (including critical care time) Labs Review Labs Reviewed - No data to display  Imaging Review No results found. I have personally reviewed and evaluated these images and lab results as part of my medical  decision-making.   EKG Interpretation None      MDM  Vital signs reviewed. Pulse ox 98% on room air. Exam favors flu. Pt to use ibuprofen evry 6 hours. Discussed need to use a mask and wash hands frequently. Pt will use claritin D and hycodan for symptoms.    Final diagnoses:  None    **I have reviewed nursing notes, vital signs, and all appropriate lab and imaging results for this patient.Ivery Quale, PA-C 11/02/15 1705  Mancel Bale, MD 11/03/15 [phone]

## 2015-11-02 NOTE — Discharge Instructions (Signed)

## 2015-11-02 NOTE — ED Notes (Signed)
Pt states that since Thursday has been having a lot of congestion in head and throat with generalized body aches.  Last meds at 0400 today.

## 2019-08-08 ENCOUNTER — Other Ambulatory Visit: Payer: Self-pay | Admitting: *Deleted

## 2019-08-08 DIAGNOSIS — Z20822 Contact with and (suspected) exposure to covid-19: Secondary | ICD-10-CM

## 2019-08-10 LAB — NOVEL CORONAVIRUS, NAA: SARS-CoV-2, NAA: NOT DETECTED

## 2019-08-10 LAB — SPECIMEN STATUS REPORT

## 2020-11-08 ENCOUNTER — Other Ambulatory Visit: Payer: 59

## 2020-11-08 DIAGNOSIS — Z20822 Contact with and (suspected) exposure to covid-19: Secondary | ICD-10-CM

## 2020-11-11 LAB — NOVEL CORONAVIRUS, NAA: SARS-CoV-2, NAA: NOT DETECTED

## 2021-04-17 ENCOUNTER — Ambulatory Visit (INDEPENDENT_AMBULATORY_CARE_PROVIDER_SITE_OTHER): Payer: No Typology Code available for payment source | Admitting: Family Medicine

## 2021-04-17 ENCOUNTER — Encounter: Payer: Self-pay | Admitting: Family Medicine

## 2021-04-17 ENCOUNTER — Ambulatory Visit (INDEPENDENT_AMBULATORY_CARE_PROVIDER_SITE_OTHER): Payer: No Typology Code available for payment source

## 2021-04-17 DIAGNOSIS — R0602 Shortness of breath: Secondary | ICD-10-CM

## 2021-04-17 DIAGNOSIS — J988 Other specified respiratory disorders: Secondary | ICD-10-CM

## 2021-04-17 LAB — CULTURE, GROUP A STREP

## 2021-04-17 LAB — VERITOR FLU A/B WAIVED
Influenza A: NEGATIVE
Influenza B: NEGATIVE

## 2021-04-17 LAB — RAPID STREP SCREEN (MED CTR MEBANE ONLY): Strep Gp A Ag, IA W/Reflex: NEGATIVE

## 2021-04-17 MED ORDER — AMOXICILLIN-POT CLAVULANATE 875-125 MG PO TABS: 1.0000 | ORAL_TABLET | Freq: Two times a day (BID) | ORAL | 0 refills | Status: AC

## 2021-04-17 MED ORDER — PREDNISONE 10 MG (21) PO TBPK: ORAL_TABLET | ORAL | 0 refills | Status: DC

## 2021-04-17 MED ORDER — ALBUTEROL SULFATE HFA 108 (90 BASE) MCG/ACT IN AERS: 2.0000 | INHALATION_SPRAY | Freq: Four times a day (QID) | RESPIRATORY_TRACT | 2 refills | Status: DC | PRN

## 2021-04-17 NOTE — Progress Notes (Signed)
Virtual Visit  Note Due to COVID-19 pandemic this visit was conducted virtually. This visit type was conducted due to national recommendations for restrictions regarding the COVID-19 Pandemic (e.g. social distancing, sheltering in place) in an effort to limit this patient's exposure and mitigate transmission in our community. All issues noted in this document were discussed and addressed.  A physical exam was not performed with this format.  I connected with John Matthews on 04/17/21 at 1035 by telephone and verified that I am speaking with the correct person using two identifiers. John Matthews is currently located at in his car and and no one is currently with him during visit. The provider, Gabriel Earing, FNP is located in their office at time of visit.  I discussed the limitations, risks, security and privacy concerns of performing an evaluation and management service by telephone and the availability of in person appointments. I also discussed with the patient that there may be a patient responsible charge related to this service. The patient expressed understanding and agreed to proceed.  CC: congestion  History and Present Illness:  HPI Cerrone reports sore throat, fatigue, headache, head congestion, chest congestion, and cough x 6 days. He is coughing up clear phlegm. He also reports chills and sweating at times. He is having body aches. He has not checked his temperature but feels like he has had a fever off and on. He is feeling short of breath with exertion today. He denies chest pain or edema. Denies nausea, vomiting, or diarrhea. He is a current smoker. He also has a history of HTN. He has had 3 negative home tests. He has not been vaccinated, denies known exposure. He has been taking alka-seltzer head cold. He feels like his symptom have gotten worse.     ROS As per HPI.   Observations/Objective: Alert and oriented x 3. Able to speak in full sentences  without difficulty.    Assessment and Plan: Tomie was seen today for nasal congestion.  Diagnoses and all orders for this visit:  Respiratory infection Covid, flu, and strep ordered. Quarantine until [employer] Will notify patient of results. Patient will come by later this afternoon for a CXR. Augment and steroid pack sent in as patient is a smoker and higher risk for bacterial infection. Discussed return precautions and when to seek emergency care.  -     Novel Coronavirus, NAA ([facility]); Future -     Veritor Flu A/B Waived -     Rapid Strep Screen ([facility] ONLY) -     DG Chest 2 View; Future -     amoxicillin-clavulanate (AUGMENTIN) 875-125 MG tablet; Take 1 tablet by mouth 2 (two) times daily for 10 days. -     predniSONE (STERAPRED UNI-PAK 21 TAB) 10 MG (21) TBPK tablet; Use as directed on back of pill pack  Shortness of breath Testing and CXR pending. Albuterol prn.  -     Novel Coronavirus, NAA ([facility]); Future -     DG Chest 2 View; Future -     albuterol (VENTOLIN HFA) 108 (90 Base) MCG/ACT inhaler; Inhale 2 puffs into the lungs every 6 (six) hours as needed for wheezing or shortness of breath.    Follow Up Instructions: Return to office for new or worsening symptoms, or if symptoms persist.     I discussed the assessment and treatment plan with the patient. The patient was provided an opportunity to ask questions and all were answered. The  patient agreed with the plan and demonstrated an understanding of the instructions.   The patient was advised to call back or seek an in-person evaluation if the symptoms worsen or if the condition fails to improve as anticipated.  The above assessment and management plan was discussed with the patient. The patient verbalized understanding of and has agreed to the management plan. Patient is aware to call the clinic if symptoms persist or worsen. Patient is aware when to return to the clinic for a follow-up visit.  Patient educated on when it is appropriate to go to the emergency department.   Time call ended:  1047  I provided 12 minutes of  non face-to-face time during this encounter.    Gabriel Earing, FNP

## 2021-04-17 NOTE — Addendum Note (Signed)
Addended by: Cassell Clement on: 04/17/2021 04:04 PM   Modules accepted: Orders

## 2021-04-18 LAB — NOVEL CORONAVIRUS, NAA: SARS-CoV-2, NAA: NOT DETECTED

## 2021-04-18 LAB — SARS-COV-2, NAA 2 DAY TAT

## 2021-06-02 ENCOUNTER — Emergency Department (HOSPITAL_COMMUNITY)
Admission: EM | Admit: 2021-06-02 | Discharge: 2021-06-02 | Disposition: A | Discharge status: Home / Self Care | Payer: No Typology Code available for payment source | Attending: Emergency Medicine | Admitting: Emergency Medicine

## 2021-06-02 ENCOUNTER — Emergency Department (HOSPITAL_COMMUNITY): Payer: No Typology Code available for payment source

## 2021-06-02 ENCOUNTER — Other Ambulatory Visit: Payer: Self-pay

## 2021-06-02 ENCOUNTER — Encounter (HOSPITAL_COMMUNITY): Payer: Self-pay | Admitting: Emergency Medicine

## 2021-06-02 DIAGNOSIS — R079 Chest pain, unspecified: Secondary | ICD-10-CM | POA: Diagnosis present

## 2021-06-02 DIAGNOSIS — R0789 Other chest pain: Secondary | ICD-10-CM | POA: Insufficient documentation

## 2021-06-02 DIAGNOSIS — R6884 Jaw pain: Secondary | ICD-10-CM | POA: Insufficient documentation

## 2021-06-02 DIAGNOSIS — F17210 Nicotine dependence, cigarettes, uncomplicated: Secondary | ICD-10-CM | POA: Diagnosis not present

## 2021-06-02 LAB — BASIC METABOLIC PANEL
Anion gap: 6 (ref 5–15)
BUN: 23 mg/dL — ABNORMAL HIGH (ref 6–20)
CO2: 25 mmol/L (ref 22–32)
Calcium: 9.1 mg/dL (ref 8.9–10.3)
Chloride: 105 mmol/L (ref 98–111)
Creatinine, Ser: 0.93 mg/dL (ref 0.61–1.24)
GFR, Estimated: >60 mL/min (ref >60)
Glucose, Bld: 93 mg/dL (ref 70–99)
Potassium: 4.6 mmol/L (ref 3.5–5.1)
Sodium: 136 mmol/L (ref 135–145)

## 2021-06-02 LAB — CBC
HCT: 47.4 % (ref 39.0–52.0)
Hemoglobin: 15.8 g/dL (ref 13.0–17.0)
MCH: 31.1 pg (ref 26.0–34.0)
MCHC: 33.3 g/dL (ref 30.0–36.0)
MCV: 93.3 fL (ref 80.0–100.0)
Platelets: 245 10*3/uL (ref 150–400)
RBC: 5.08 MIL/uL (ref 4.22–5.81)
RDW: 12.7 % (ref 11.5–15.5)
WBC: 8 10*3/uL (ref 4.0–10.5)
nRBC: 0 % (ref 0.0–0.2)

## 2021-06-02 LAB — TROPONIN I (HIGH SENSITIVITY)
Troponin I (High Sensitivity): 2 ng/L (ref <18)
Troponin I (High Sensitivity): 3 ng/L (ref <18)

## 2021-06-02 MED ORDER — ACETAMINOPHEN 500 MG PO TABS
1000.0000 mg | ORAL_TABLET | Freq: Once | ORAL | Status: AC
Start: 2021-06-02 — End: 2021-06-02
  Administered 2021-06-02: 1000 mg via ORAL
  Filled 2021-06-02: qty 2

## 2021-06-02 MED ORDER — FAMOTIDINE 20 MG PO TABS
20.0000 mg | ORAL_TABLET | Freq: Once | ORAL | Status: AC
  Administered 2021-06-02: 20 mg via ORAL
  Filled 2021-06-02: qty 1

## 2021-06-02 MED ORDER — ALUM & MAG HYDROXIDE-SIMETH 200-200-20 MG/5ML PO SUSP
30.0000 mL | Freq: Once | ORAL | Status: AC
  Administered 2021-06-02: 30 mL via ORAL
  Filled 2021-06-02: qty 30

## 2021-06-02 NOTE — ED Triage Notes (Signed)
Pt c/o intermittent chest pressure, left arm throbbing and "feels like someone is grabbing my jaw." Started upon waking up this morning. Pt takes 4 goody powders/day

## 2021-06-02 NOTE — Discharge Instructions (Addendum)
Your work-up today was reassuring.  I do recommend that you follow-up with the cardiologist, Dr. Diona Browner.  I also recommend that you follow-up with your primary care provider and take over-the-counter Prilosec daily as directed.  Please return to the emergency department for any new or worsening symptoms.

## 2021-06-02 NOTE — ED Provider Notes (Signed)
[facility] EMERGENCY DEPARTMENT Provider Note   CSN: [id number] Arrival date & time: 06/02/21  1220     History No chief complaint on file.   John Matthews is a 50 y.o. male.  HPI     John Matthews is a 50 y.o. male who presents to the Emergency Department complaining of left-sided chest pain.,  Dyspnea on exertion, left arm pain and jaw pain.  Symptoms began at 6:30 AM this morning.  He states he woke approximately 3 AM with what he felt to be acid reflux.  Did not take any medication at that time.  Woke up again at 630 with intermittent pains to his left chest with pain to his left arm, neck, and entire jaw.  Noticed tingling sensations to his fingertips as well.  Symptoms were associated with some diaphoresis, denies nausea or vomiting.  Went to work this morning but symptoms persisted.  States his symptoms subsided approximately 1 hour ago and now just describes having a pressure sensation of his left chest.  States he had a cardiac stress test approximately 10 years ago without any significant cardiac history.  Ran out of his acid reflux medication a week ago.  Patient is a cigarette smoker and does have a family history of heart disease.  History reviewed. No pertinent past medical history.  Patient Active Problem List   Diagnosis Date Noted   Precordial pain 11/17/2011   Elevated blood pressure 11/17/2011   Tobacco abuse 11/17/2011   Family history of aortic aneurysm 11/17/2011    Past Surgical History:  Procedure Laterality Date   BACK SURGERY  2003       Family History  Problem Relation Age of Onset   Heart disease Maternal Grandfather 35       Deceased   Diabetes Other    Heart disease Paternal Grandmother 73       Deceased   Heart attack Father 18       Deceased   Hypertension Sister        Alive   Aneurysm Father     Social History   Tobacco Use   Smoking status: Every Day    Packs/day: 1.00    Types: Cigarettes  Substance Use Topics    Alcohol use: Yes    Comment: Occasional   Drug use: No    Home Medications Prior to Admission medications   Medication Sig Start Date End Date Taking? Authorizing Provider  albuterol (VENTOLIN HFA) 108 (90 Base) MCG/ACT inhaler Inhale 2 puffs into the lungs every 6 (six) hours as needed for wheezing or shortness of breath. 04/17/21   Gabriel Earing, FNP  HYDROcodone-homatropine [facility]) 5-1.5 MG/5ML syrup Take 5 mLs by mouth every 6 (six) hours as needed. 11/02/15   Ivery Quale, PA-C  loratadine-pseudoephedrine (CLARITIN-D 12 HOUR) 5-120 MG tablet Take 1 tablet by mouth 2 (two) times daily. 11/02/15   Ivery Quale, PA-C  oxymetazoline (AFRIN) 0.05 % nasal spray Place 1 spray into both nostrils 2 (two) times daily as needed for congestion.    [provider]  Phenyleph-CPM-DM-Aspirin (ALKA-SELTZER PLUS COLD & COUGH) 7.06-03-09-325 MG TBEF Take 2 tablets by mouth every 4 (four) hours as needed (Cold Symptoms).    [provider]  predniSONE (STERAPRED UNI-PAK 21 TAB) 10 MG (21) TBPK tablet Use as directed on back of pill pack 04/17/21   Gabriel Earing, FNP    Allergies    Patient has no known allergies.  Review of Systems  Review of Systems  Constitutional:  Negative for chills and fever.  HENT:  Negative for trouble swallowing.   Respiratory:  Positive for shortness of breath. Negative for cough and wheezing.   Cardiovascular:  Positive for chest pain. Negative for palpitations and leg swelling.  Gastrointestinal:  Negative for abdominal pain, nausea and vomiting.  Genitourinary:  Negative for dysuria, flank pain and hematuria.  Musculoskeletal:  Negative for arthralgias, back pain, myalgias, neck pain and neck stiffness.  Skin:  Negative for rash.  Neurological:  Negative for dizziness, syncope, speech difficulty, weakness and numbness.  Hematological:  Does not bruise/bleed easily.   Physical Exam Updated Vital Signs BP 124/76   Pulse 68   Temp 98.2  F (36.8 C) (Oral)   Resp 16   Ht 6\' 2"  (1.88 m)   Wt 112.5 kg   SpO2 100%   BMI 31.84 kg/m   Physical Exam Vitals and nursing note reviewed.  Constitutional:      General: He is not in acute distress.    Appearance: Normal appearance. He is not ill-appearing.  HENT:     Head: Normocephalic.     Mouth/Throat:     Mouth: Mucous membranes are moist.  Eyes:     Conjunctiva/sclera: Conjunctivae normal.  Neck:     Thyroid: No thyromegaly.     Meningeal: Kernig's sign absent.  Cardiovascular:     Rate and Rhythm: Normal rate and regular rhythm.     Pulses: Normal pulses.  Pulmonary:     Effort: Pulmonary effort is normal.     Breath sounds: Normal breath sounds. No wheezing.  Chest:     Chest wall: No tenderness.  Abdominal:     Palpations: Abdomen is soft.     Tenderness: There is no abdominal tenderness. There is no guarding or rebound.  Musculoskeletal:        General: Normal range of motion.     Cervical back: Normal range of motion and neck supple.     Right lower leg: No edema.     Left lower leg: No edema.  Skin:    General: Skin is warm.     Capillary Refill: Capillary refill takes less than 2 seconds.     Findings: No rash.  Neurological:     General: No focal deficit present.     Mental Status: He is alert and oriented to person, place, and time.     Sensory: No sensory deficit.     Motor: No weakness.    ED Results / Procedures / Treatments   Labs (all labs ordered are listed, but only abnormal results are displayed) Labs Reviewed  BASIC METABOLIC PANEL - Abnormal; Notable for the following components:      Result Value   BUN 23 (*)    All other components within normal limits  CBC  TROPONIN I (HIGH SENSITIVITY)  TROPONIN I (HIGH SENSITIVITY)    EKG EKG Interpretation  Date/Time:  Monday June 02 2021 12:31:32 EDT Ventricular Rate:  81 PR Interval:  154 QRS Duration: 84 QT Interval:  346 QTC Calculation: 401 R Axis:   41 Text  Interpretation: Normal sinus rhythm Normal ECG Confirmed by 04-26-2002 (Cathren Laine) on 06/02/2021 1:22:45 PM  Radiology DG Chest 2 View  Result Date: 06/02/2021 CLINICAL DATA:  Chest pain. EXAM: CHEST - 2 VIEW COMPARISON:  April 17, 2021. FINDINGS: The heart size and mediastinal contours are within normal limits. Both lungs are clear. No visible pleural effusions or pneumothorax. Right-sided skin  fold. No acute osseous abnormality. No significant change from prior. IMPRESSION: No evidence of acute cardiopulmonary disease. Electronically Signed   By: Feliberto Harts MD   On: 06/02/2021 13:49    Procedures Procedures   Medications Ordered in ED Medications - No data to display  ED Course  I have reviewed the triage vital signs and the nursing notes.  Pertinent labs & imaging results that were available during my care of the patient were reviewed by me and considered in my medical decision making (see chart for details).    MDM Rules/Calculators/A&P                           HEART score 5  Patient here with concerning story for left-sided chest pain that began this morning.  Has family history of heart disease and he is a current cigarette smoker.  States he had cardiac catheterization in 2013 w/o significant finding, EF 60%  On exam, patient in no apparent distress.  Describes having some continued mild pressure of his chest but denies any chest pain for greater than 1 hour.  Will obtain labs EKG and chest x-ray.  We will also likely consult cardiology.  On recheck, pt resting comfortably.  His left sided chest sx's further improving after GI cocktail. Work up reassuring, no ischemic EKG changes, CXR w/o acute finding, Delta trop pending, I will also consult cards as pt may benefit from repeat cardiac cath.    1620 discussed findings with Dr. Diona Browner and he agrees to see patient in office for follow-up.  Patient does report feeling better after GI cocktail.  Delta troponin reassuring.   Vital signs also reassuring.  I have offered prescription PPI, but patient prefers to take over-the-counter Prilosec.  He is agreeable to care plan including follow-up with PCP and with Dr. Diona Browner.  Return precautions were discussed.  Final Clinical Impression(s) / ED Diagnoses Final diagnoses:  Chest pain, unspecified type    Rx / DC Orders ED Discharge Orders     None        Pauline Aus, PA-C 06/02/21 1645    Cathren Laine, MD 06/04/21 0900

## 2021-06-24 ENCOUNTER — Encounter: Payer: Self-pay | Admitting: Cardiology

## 2021-06-24 ENCOUNTER — Ambulatory Visit: Payer: No Typology Code available for payment source | Admitting: Cardiology

## 2021-06-24 ENCOUNTER — Encounter: Payer: Self-pay | Admitting: *Deleted

## 2021-06-24 VITALS — BP 124/78 | HR 96 | Ht 74.0 in | Wt 242.2 lb

## 2021-06-24 DIAGNOSIS — R0602 Shortness of breath: Secondary | ICD-10-CM

## 2021-06-24 DIAGNOSIS — R079 Chest pain, unspecified: Secondary | ICD-10-CM

## 2021-06-24 DIAGNOSIS — R030 Elevated blood-pressure reading, without diagnosis of hypertension: Secondary | ICD-10-CM | POA: Diagnosis not present

## 2021-06-24 NOTE — Progress Notes (Signed)
Cardiology Office Note  Date: 06/24/2021   ID: Perkins, Molina 1971/07/27, MRN [id number]  PCP:  Gabriel Earing, FNP  Cardiologist:  Nona Dell, MD Electrophysiologist:  None   Chief Complaint  Patient presents with   History of chest pain     History of Present Illness: John Matthews is a 50 y.o. male referred back to the office after recent ER visit at [facility] with chest discomfort.  I reviewed the records.  High-sensitivity troponin I levels were normal and chest x-ray showed no acute findings.  I personally reviewed his ECG which showed normal sinus rhythm. . He states that he awoke that morning with a vague discomfort in his chest, waxed and waned, later became more of a pressure and a discomfort in his neck and jaw.  The symptoms eventually resolved without specific intervention, reportedly did not respond to GI cocktail.  He does not report any regular reflux.  He has been somewhat more fatigued this past year, intermittent shortness of breath with activity.  He also mentions that his blood pressure has been up intermittently when he checks it at home.  He works at [employer].  He was seen in our office back in 2013 for evaluation of chest pain and underwent a normal cardiac catheterization at that time.  He has not undergone interval ischemic testing.  Past Medical History:  Diagnosis Date   URI (upper respiratory infection)     Past Surgical History:  Procedure Laterality Date   BACK SURGERY  2003    No current outpatient medications on file.   No current facility-administered medications for this visit.   Allergies:  Patient has no known allergies.   Social History: The patient  reports that he has been smoking cigarettes. He has been smoking an average of 1 pack per day. He does not have any smokeless tobacco history on file. He reports current alcohol use. He reports that he does not use drugs.   Family  History: The patient's family history includes Aneurysm in his father; Diabetes in an other family member; Heart attack (age of onset: 4) in his father; Heart disease (age of onset: 48) in his maternal grandfather and paternal grandmother; Hypertension in his sister.   ROS: No palpitations or syncope.  No orthopnea or PND.  Physical Exam: VS:  BP 124/78   Pulse 96   Ht 6\' 2"  (1.88 m)   Wt 242 lb 3.2 oz (109.9 kg)   SpO2 96%   BMI 31.10 kg/m , BMI Body mass index is 31.1 kg/m.  Wt Readings from Last 3 Encounters:  06/24/21 242 lb 3.2 oz (109.9 kg)  06/02/21 248 lb (112.5 kg)  12/10/11 207 lb (93.9 kg)    General: Patient appears comfortable at rest. HEENT: Conjunctiva and lids normal, wearing a mask. Neck: Supple, no elevated JVP or carotid bruits, no thyromegaly. Lungs: Clear to auscultation, nonlabored breathing at rest. Cardiac: Regular rate and rhythm, no S3 or significant systolic murmur, no pericardial rub. Abdomen: Soft, nontender, bowel sounds present. Extremities: No pitting edema, distal pulses 2+. Skin: Warm and dry. Musculoskeletal: No kyphosis. Neuropsychiatric: Alert and oriented x3, affect grossly appropriate.  ECG:  An ECG dated 06/02/2021 was personally reviewed today and demonstrated:  Sinus rhythm.  Recent Labwork: 06/02/2021: BUN 23; Creatinine, Ser 0.93; Hemoglobin 15.8; Platelets 245; Potassium 4.6; Sodium 136   Other Studies Reviewed Today:  Cardiac catheterization 11/20/2011: Hemodynamic Findings: Central aortic pressure: 81/60 Left  ventricular pressure: 81/8/10   Angiographic Findings:   Left main: No evidence of disease.    Left Anterior Descending Artery: No angiographic evidence of disease. Large vessel that courses to the apex.    Circumflex Artery: No angiographic evidence of disease. Large vessel with moderate sized OM branch.    Right Coronary Artery: Large dominant vessel with no angiographic evidence of disease.    Left Ventricular  Angiogram:Normal LV systolic function, LVEF 60%.    Aortic Root Angiogram: No dilatation.    Impression: 1. No angiographic evidence of CAD 2. Normal LV systolic function. 3. Non-cardiac etiology of chest pain  Chest x-ray 06/02/2021: FINDINGS: The heart size and mediastinal contours are within normal limits. Both lungs are clear. No visible pleural effusions or pneumothorax. Right-sided skin fold. No acute osseous abnormality. No significant change from prior.   IMPRESSION: No evidence of acute cardiopulmonary disease.  Assessment and Plan:  1.  Recurrent chest pain as discussed above concerning for angina based on description although associated with reassuring work-up at ER visit including cardiac enzymes and ECG as well as chest x-ray.  He does have a family history of heart disease and underwent cardiac catheterization back in 2013 that showed normal coronaries.  He has not undergone interval ischemic or structural cardiac evaluation.  In addition reporting shortness of breath intermittently.  Plan to obtain an exercise Myoview and echocardiogram for follow-up evaluation.  2.  Intermittent blood pressure elevation.  Patient's wife is a Engineer, civil (consulting).  I have asked him to make a follow-up visit with his PCP and record home blood pressures taken with manual cuff prior to that visit.  Blood pressure today is normal in clinic.  Medication Adjustments/Labs and Tests Ordered: Current medicines are reviewed at length with the patient today.  Concerns regarding medicines are outlined above.   Tests Ordered: Orders Placed This Encounter  Procedures   NM Myocar Multi W/Spect W/Wall Motion / EF   ECHOCARDIOGRAM COMPLETE     Medication Changes: No orders of the defined types were placed in this encounter.   Disposition:  Follow up  test results.  Signed, Jonelle Sidle, MD, [facility] 06/24/2021 5:02 PM    [facility] [address] [place], [place], [place]  [address] Phone: [phone]; Fax: [phone]

## 2021-06-24 NOTE — Patient Instructions (Addendum)
Medication Instructions:  Your physician recommends that you continue on your current medications as directed. Please refer to the Current Medication list given to you today.  Labwork: none  Testing/Procedures: Your physician has requested that you have an echocardiogram. Echocardiography is a painless test that uses sound waves to create images of your heart. It provides your doctor with information about the size and shape of your heart and how well your heart's chambers and valves are working. This procedure takes approximately one hour. There are no restrictions for this procedure. Your physician has requested that you have en exercise stress myoview. For further information please visit [email or web address]. Please follow instruction sheet, as given.  Follow-Up: Your physician recommends that you schedule a follow-up appointment in: pending  Any Other Special Instructions Will Be Listed Below (If Applicable).  If you need a refill on your cardiac medications before your next appointment, please call your pharmacy. 

## 2021-07-18 ENCOUNTER — Other Ambulatory Visit: Payer: Self-pay

## 2021-07-18 ENCOUNTER — Ambulatory Visit (HOSPITAL_COMMUNITY)
Admission: RE | Admit: 2021-07-18 | Discharge: 2021-07-18 | Disposition: A | Discharge status: Home / Self Care | Payer: No Typology Code available for payment source | Source: Ambulatory Visit | Attending: Cardiology | Admitting: Cardiology

## 2021-07-18 DIAGNOSIS — R079 Chest pain, unspecified: Secondary | ICD-10-CM | POA: Diagnosis present

## 2021-07-18 DIAGNOSIS — R0602 Shortness of breath: Secondary | ICD-10-CM | POA: Diagnosis present

## 2021-07-18 LAB — NM MYOCAR MULTI W/SPECT W/WALL MOTION / EF
Angina Index: 0
Duke Treadmill Score: 6
Estimated workload: 7
Exercise duration (min): 5 min
Exercise duration (sec): 34 s
LV dias vol: 108 mL (ref 62–150)
LV sys vol: 50 mL
MPHR: 173 {beats}/min
Nuc Stress EF: 54 %
Peak HR: 151 {beats}/min
Percent HR: 87 %
RATE: 0.4
RPE: 17
Rest HR: 93 {beats}/min
Rest Nuclear Isotope Dose: 10.8 mCi
SDS: 0
SRS: 6
SSS: 2
ST Depression (mm): 0 mm
Stress Nuclear Isotope Dose: 31 mCi
TID: 0.95

## 2021-07-18 LAB — ECHOCARDIOGRAM COMPLETE
AR max vel: 2.52 cm2
AV Area VTI: 2.67 cm2
AV Area mean vel: 2.62 cm2
AV Mean grad: 4 mmHg
AV Peak grad: 7.5 mmHg
Ao pk vel: 1.37 m/s
Area-P 1/2: 3.74 cm2
Calc EF: 54.1 %
MV VTI: 3.48 cm2
S' Lateral: 3.1 cm
Single Plane A2C EF: 56.7 %
Single Plane A4C EF: 51.3 %

## 2021-07-18 MED ORDER — SODIUM CHLORIDE FLUSH 0.9 % IV SOLN
INTRAVENOUS | Status: AC
  Administered 2021-07-18: 10 mL via INTRAVENOUS
  Filled 2021-07-18: qty 10

## 2021-07-18 MED ORDER — TECHNETIUM TC 99M TETROFOSMIN IV KIT
10.0000 | PACK | Freq: Once | INTRAVENOUS | Status: AC | PRN
  Administered 2021-07-18: 10.75 via INTRAVENOUS

## 2021-07-18 MED ORDER — REGADENOSON 0.4 MG/5ML IV SOLN
INTRAVENOUS | Status: AC
  Filled 2021-07-18: qty 5

## 2021-07-18 MED ORDER — TECHNETIUM TC 99M TETROFOSMIN IV KIT
30.0000 | PACK | Freq: Once | INTRAVENOUS | Status: AC | PRN
  Administered 2021-07-18: 31 via INTRAVENOUS

## 2021-07-28 ENCOUNTER — Telehealth: Payer: Self-pay | Admitting: Cardiology

## 2021-07-28 NOTE — Telephone Encounter (Signed)
Lesle Chris, LPN  3/81/0175  2:54 PM EDT Back to Top    Notified, copy to pcp.

## 2021-07-28 NOTE — Telephone Encounter (Signed)
STRESS TEST -   Jonelle Sidle, MD  07/18/2021  3:44 PM EDT     Results reviewed.  Please let him know that the stress test was overall low risk, perfusion defects look to be secondary to diaphragmatic attenuation, no definite ischemia to suggest obstructive CAD at this point and his treadmill was low risk as well.  Please schedule a follow-up visit in the next few weeks to see how he is doing.    ECHO -  Jonelle Sidle, MD  07/18/2021  4:39 PM EDT     Results reviewed.  LVEF low normal range at 50 to 55% without wall motion abnormalities, no significant valvular abnormalities

## 2021-07-28 NOTE — Telephone Encounter (Signed)
Pt calling for Stress Test and Echo results   [phone]

## 2021-08-10 NOTE — Progress Notes (Deleted)
Cardiology Office Note  Date: 08/10/2021   ID: John Matthews, DOB [birth year], MRN [id number]  PCP:  Gabriel Earing, FNP  Cardiologist:  Nona Dell, MD Electrophysiologist:  None   Chief Complaint: Follow-up chest discomfort, shortness of breath status post exercise Myoview and echocardiogram  History of Present Illness: John Matthews is a 50 y.o. male with a history of precordial pain, elevated blood pressure, tobacco abuse, family history of aortic aneurysm.  He last saw Dr. Diona Browner on 06/24/2021 for recurrent chest pain.  He had a recent ER visit to [facility] with chest discomfort.  Troponin levels were normal.  Chest x-ray was negative.  EKG showed normal sinus rhythm.  He stated he awoke that morning prior to presentation with vague discomfort in chest with waxing and waning quality, later becoming more blood pressure and discomfort in neck and jaw.  Symptoms resolved while at intervention.  Did not respond to GI cocktail.  Described being somewhat more fatigued this past year.  Intermittent shortness of breath with activity.  Blood pressure has been intermittently up when checking it at home.  Past Medical History:  Diagnosis Date   URI (upper respiratory infection)     Past Surgical History:  Procedure Laterality Date   BACK SURGERY  2003    No current outpatient medications on file.   No current facility-administered medications for this visit.   Allergies:  Patient has no known allergies.   Social History: The patient  reports that he has been smoking cigarettes. He has been smoking an average of 1 pack per day. He does not have any smokeless tobacco history on file. He reports current alcohol use. He reports that he does not use drugs.   Family History: The patient's family history includes Aneurysm in his father; Diabetes in an other family member; Heart attack (age of onset: 58) in his father; Heart disease (age of onset: 98) in his maternal  grandfather and paternal grandmother; Hypertension in his sister.   ROS:  Please see the history of present illness. Otherwise, complete review of systems is positive for none.  All other systems are reviewed and negative.   Physical Exam: VS:  There were no vitals taken for this visit., BMI There is no height or weight on file to calculate BMI.  Wt Readings from Last 3 Encounters:  06/24/21 242 lb 3.2 oz (109.9 kg)  06/02/21 248 lb (112.5 kg)  12/10/11 207 lb (93.9 kg)    General: Patient appears comfortable at rest. HEENT: Conjunctiva and lids normal, oropharynx clear with moist mucosa. Neck: Supple, no elevated JVP or carotid bruits, no thyromegaly. Lungs: Clear to auscultation, nonlabored breathing at rest. Cardiac: Regular rate and rhythm, no S3 or significant systolic murmur, no pericardial rub. Abdomen: Soft, nontender, no hepatomegaly, bowel sounds present, no guarding or rebound. Extremities: No pitting edema, distal pulses 2+. Skin: Warm and dry. Musculoskeletal: No kyphosis. Neuropsychiatric: Alert and oriented x3, affect grossly appropriate.  ECG:  {EKG/Telemetry Strips Reviewed:[phone]}  Recent Labwork: 06/02/2021: BUN 23; Creatinine, Ser 0.93; Hemoglobin 15.8; Platelets 245; Potassium 4.6; Sodium 136  No results found for: CHOL, TRIG, HDL, CHOLHDL, VLDL, LDLCALC, LDLDIRECT  Other Studies Reviewed Today:  Echocardiogram 07/18/2021 1. Left ventricular ejection fraction, by estimation, is 50 to 55%. The  left ventricle has low normal function. The left ventricle has no regional  wall motion abnormalities. There is mild left ventricular hypertrophy.  Left ventricular diastolic  parameters were normal.   2. Right ventricular  systolic function is normal. The right ventricular  size is normal. Tricuspid regurgitation signal is inadequate for assessing  PA pressure.   3. The mitral valve is grossly normal. No evidence of mitral valve  regurgitation.   4. The aortic  valve is tricuspid. Aortic valve regurgitation is not  visualized. No aortic stenosis is present. Aortic valve mean gradient  measures 4.0 mmHg.   5. Unable to estimate CVP.   Comparison(s): No prior Echocardiogram.   Exercise Myoview stress test 07/18/2021    The study is low risk.   No ST deviation was noted. There were no arrhythmias during stress. The ECG was negative for ischemia.   LV perfusion is abnormal. Defect 1: There is a small defect with mild reduction in uptake present in the apical anteroseptal location(s) that is fixed. There is normal wall motion in the defect area. Consistent with artifact caused by diaphragmatic attenuation. Defect 2: There is a medium defect with mild reduction in uptake present in the apical to basal inferolateral location(s) that is fixed. There is normal wall motion in the defect area. Consistent with artifact caused by diaphragmatic attenuation.   Left ventricular function is normal. Nuclear stress EF: 54 %. End diastolic cavity size is normal. End systolic cavity size is normal.   Low risk study.  Apical anteroseptal and apical to basal inferolateral defects are secondary to diaphragmatic attenuation, more prominent on rest imaging.  No significant ischemic territories.  LVEF 54%.  Duke treadmill score low risk at 6.  Hypertensive response noted to exercise.            Cardiac catheterization 11/20/2011: Hemodynamic Findings: Central aortic pressure: 81/60 Left ventricular pressure: 81/8/10   Angiographic Findings:   Left main: No evidence of disease.    Left Anterior Descending Artery: No angiographic evidence of disease. Large vessel that courses to the apex.    Circumflex Artery: No angiographic evidence of disease. Large vessel with moderate sized OM branch.    Right Coronary Artery: Large dominant vessel with no angiographic evidence of disease.    Left Ventricular Angiogram:Normal LV systolic function, LVEF 60%.    Aortic Root  Angiogram: No dilatation.    Impression: 1. No angiographic evidence of CAD 2. Normal LV systolic function. 3. Non-cardiac etiology of chest pain   Chest x-ray 06/02/2021: FINDINGS: The heart size and mediastinal contours are within normal limits. Both lungs are clear. No visible pleural effusions or pneumothorax. Right-sided skin fold. No acute osseous abnormality. No significant change from prior.   IMPRESSION: No evidence of acute cardiopulmonary disease.   Assessment and Plan:  1. Recurrent chest pain   2. Transient elevated blood pressure    1. Recurrent chest pain ***  2. Transient elevated blood pressure ***   Medication Adjustments/Labs and Tests Ordered: Current medicines are reviewed at length with the patient today.  Concerns regarding medicines are outlined above.   Disposition: Follow-up with ***  Signed, Rennis Harding, NP 08/10/2021 9:46 PM    [facility] [address] [place], [place], [place] [address] Phone: [phone]; Fax: [phone]

## 2021-08-11 ENCOUNTER — Ambulatory Visit: Payer: No Typology Code available for payment source | Admitting: Family Medicine

## 2021-08-11 DIAGNOSIS — R030 Elevated blood-pressure reading, without diagnosis of hypertension: Secondary | ICD-10-CM

## 2021-08-11 DIAGNOSIS — R079 Chest pain, unspecified: Secondary | ICD-10-CM

## 2022-03-08 IMAGING — DX DG CHEST 2V
2 series · 2 of 2 positions shown · non-contrast
Comparison: None.

CLINICAL DATA: Shortness of breath

EXAM:
CHEST - 2 VIEW

[chest pa]
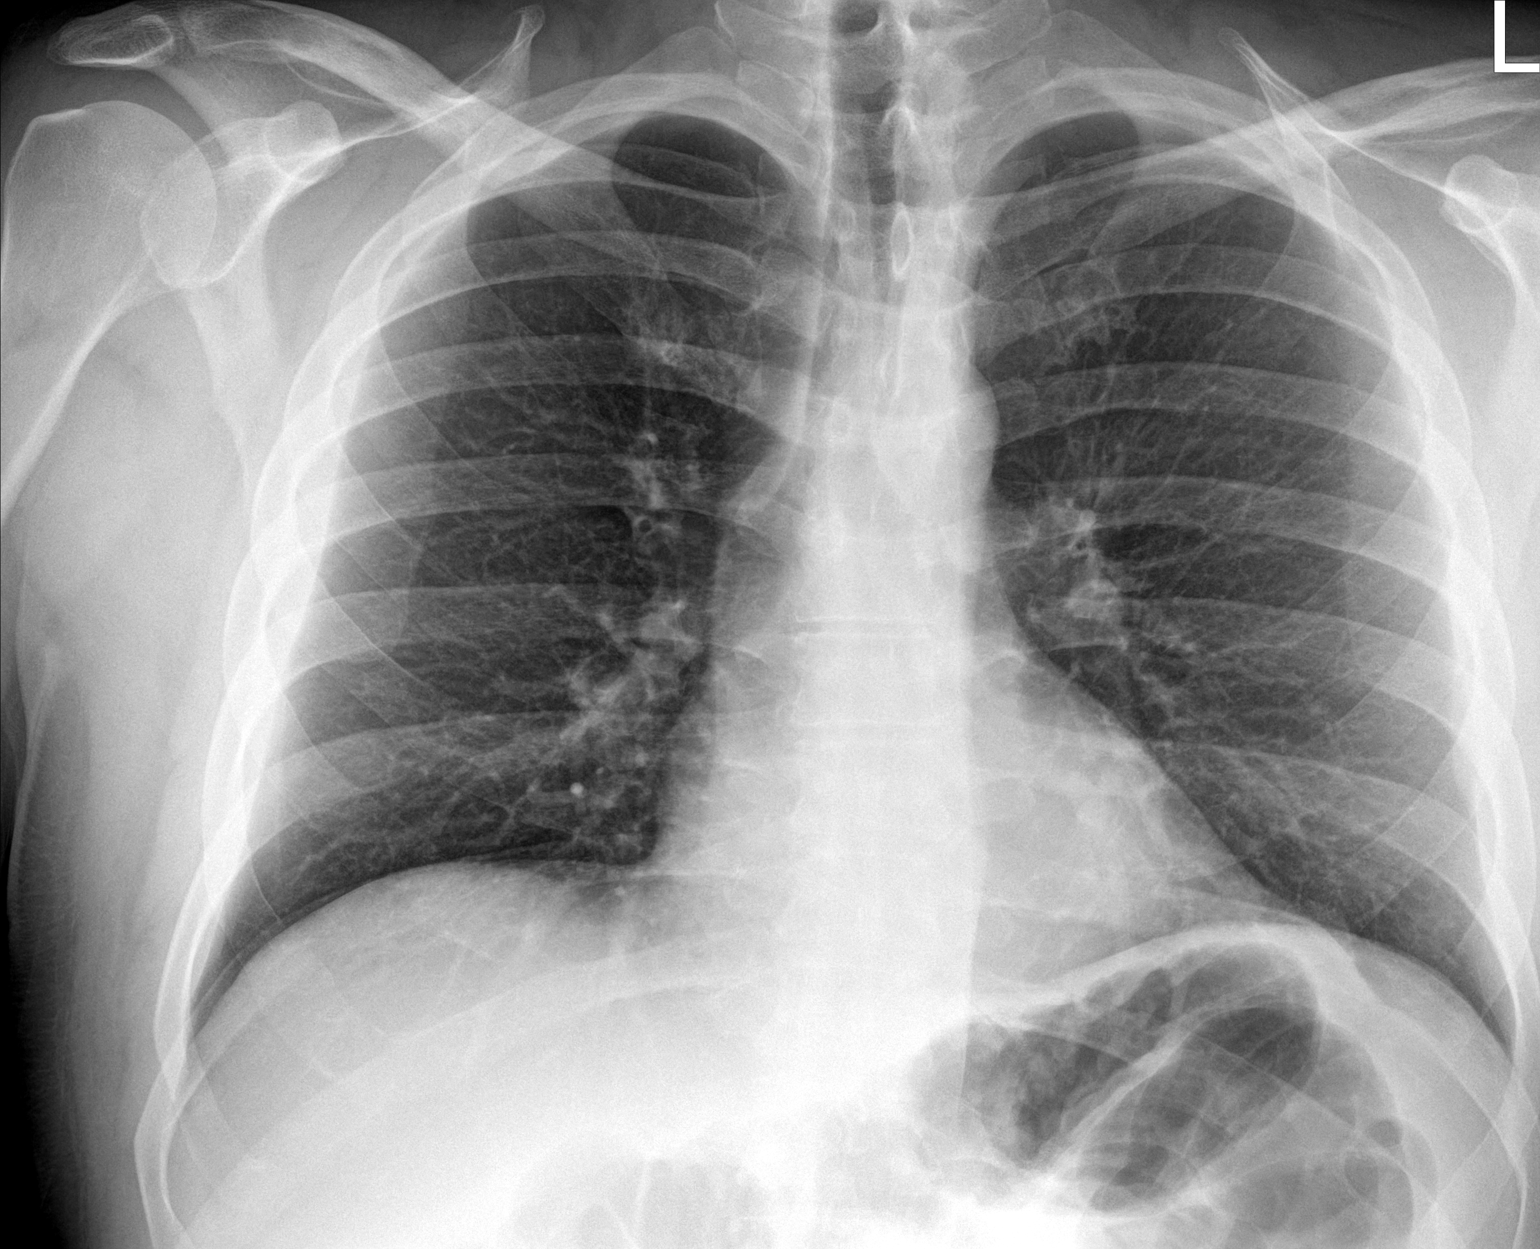

[chest lat]
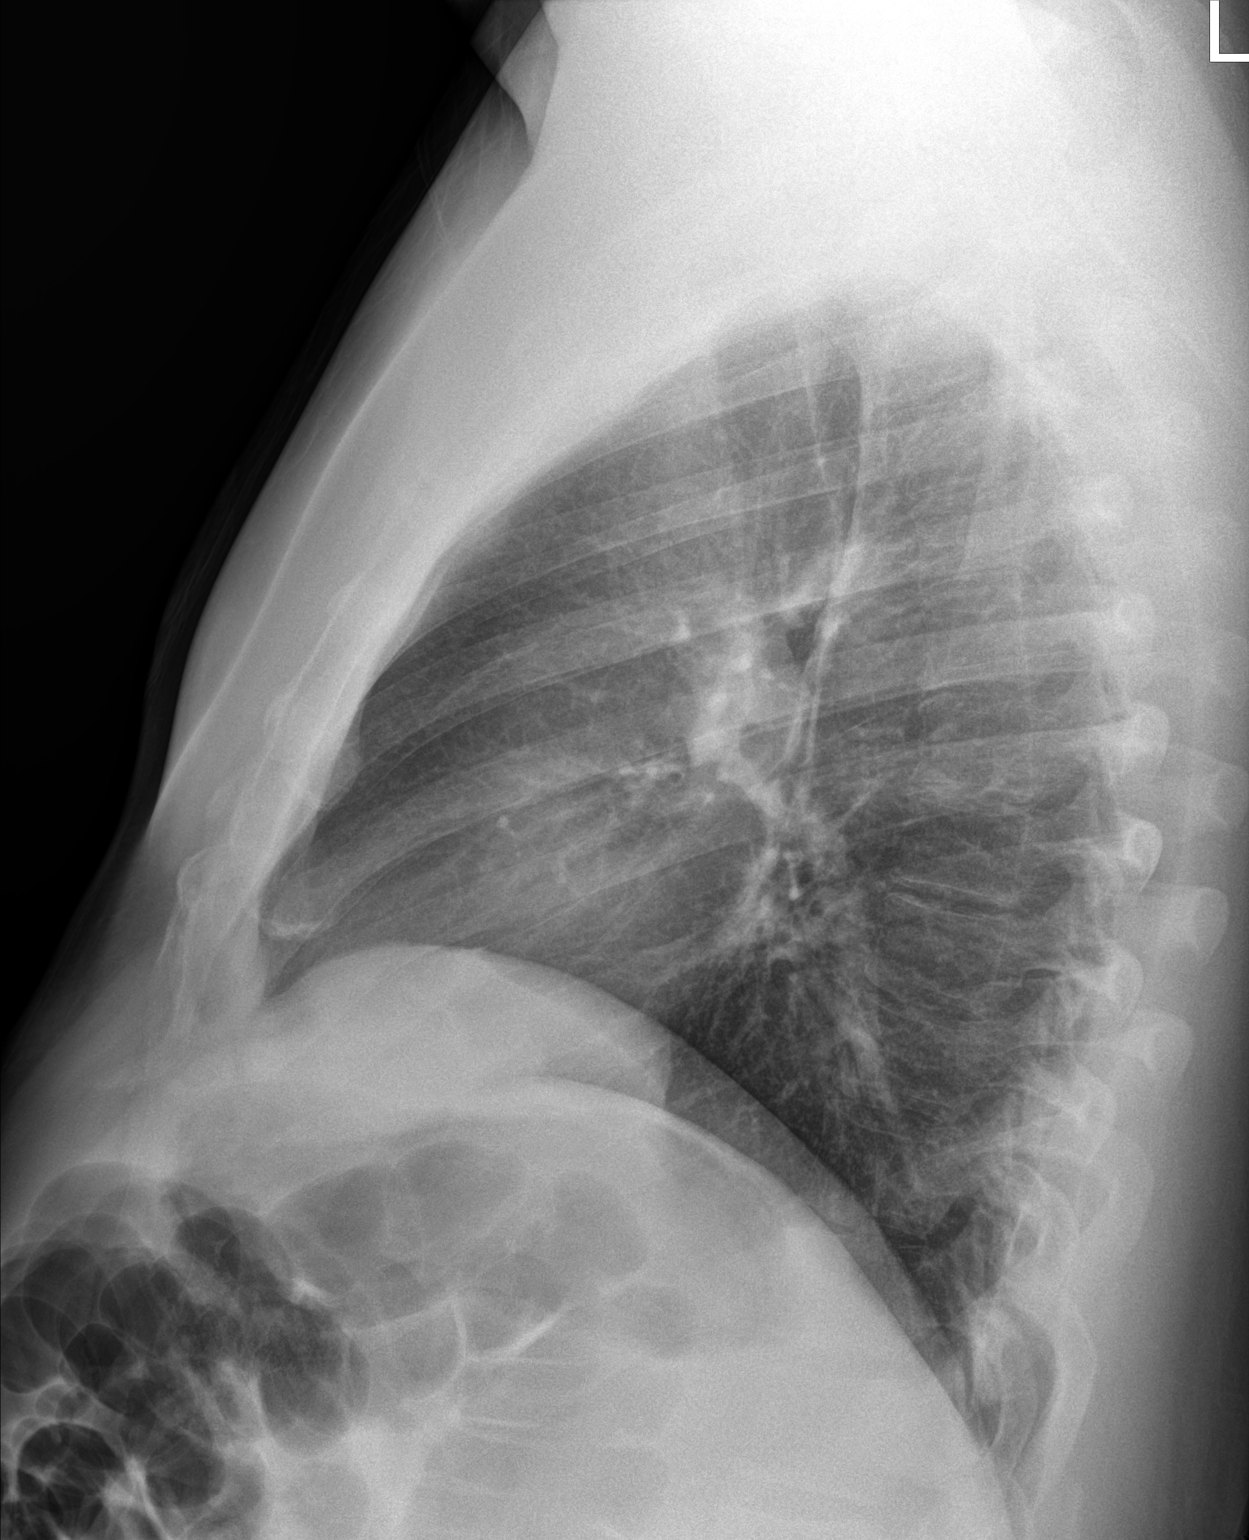

[2 of 2 positions shown; findings below may reference images not displayed]

FINDINGS: The heart size and mediastinal contours are within normal limits.
Both lungs are clear. The visualized skeletal structures are
unremarkable.
IMPRESSION: No active cardiopulmonary disease.

## 2022-06-30 ENCOUNTER — Ambulatory Visit (INDEPENDENT_AMBULATORY_CARE_PROVIDER_SITE_OTHER): Payer: No Typology Code available for payment source | Admitting: Family Medicine

## 2022-06-30 ENCOUNTER — Other Ambulatory Visit (HOSPITAL_COMMUNITY): Payer: Self-pay

## 2022-06-30 ENCOUNTER — Encounter: Payer: Self-pay | Admitting: Family Medicine

## 2022-06-30 VITALS — BP 121/77 | HR 78 | Temp 98.6°F | Ht 74.0 in | Wt 252.0 lb

## 2022-06-30 DIAGNOSIS — Z0001 Encounter for general adult medical examination with abnormal findings: Secondary | ICD-10-CM

## 2022-06-30 DIAGNOSIS — Z131 Encounter for screening for diabetes mellitus: Secondary | ICD-10-CM

## 2022-06-30 DIAGNOSIS — L821 Other seborrheic keratosis: Secondary | ICD-10-CM

## 2022-06-30 DIAGNOSIS — Z0000 Encounter for general adult medical examination without abnormal findings: Secondary | ICD-10-CM

## 2022-06-30 DIAGNOSIS — Z13220 Encounter for screening for lipoid disorders: Secondary | ICD-10-CM | POA: Diagnosis not present

## 2022-06-30 DIAGNOSIS — Z114 Encounter for screening for human immunodeficiency virus [HIV]: Secondary | ICD-10-CM | POA: Diagnosis not present

## 2022-06-30 DIAGNOSIS — Z1159 Encounter for screening for other viral diseases: Secondary | ICD-10-CM

## 2022-06-30 DIAGNOSIS — Z1329 Encounter for screening for other suspected endocrine disorder: Secondary | ICD-10-CM | POA: Diagnosis not present

## 2022-06-30 DIAGNOSIS — K219 Gastro-esophageal reflux disease without esophagitis: Secondary | ICD-10-CM

## 2022-06-30 LAB — BAYER DCA HB A1C WAIVED: HB A1C (BAYER DCA - WAIVED): 5.6 % (ref 4.8–5.6)

## 2022-06-30 MED ORDER — PANTOPRAZOLE SODIUM 40 MG PO TBEC
40.0000 mg | DELAYED_RELEASE_TABLET | Freq: Every day | ORAL | 3 refills | Status: DC
  Filled 2022-06-30: qty 90, 90d supply, fill #0

## 2022-06-30 NOTE — Progress Notes (Signed)
John Matthews is a 51 y.o. male presents to office today for annual physical exam examination.    Concerns today include: 1. Acid Reflux, worse at night and after certain foods.   Occupation: Set designer Marital status: married Substance use: none Diet: fairly healthy, Exercise: not regular Last eye exam: yearly Last dental exam: yearly Last colonoscopy: never and declines screening today Immunizations needed: Flu Vaccine: declines  Tdap Vaccine: states had in last 3 years  Past Medical History:  Diagnosis Date   Generalized headaches    Hypertension    URI (upper respiratory infection)    Social History   Socioeconomic History   Marital status: Married    Spouse name: Not on file   Number of children: Not on file   Years of education: 12th grade   Highest education level: Not on file  Occupational History    Employer: [employer]  Tobacco Use   Smoking status: Every Day    Packs/day: 1.00    Types: Cigarettes   Smokeless tobacco: Not on file  Substance and Sexual Activity   Alcohol use: Yes    Comment: Occasional   Drug use: No   Sexual activity: Yes  Other Topics Concern   Not on file  Social History Narrative   Not on file   Social Determinants of Health   Financial Resource Strain: Not on file  Food Insecurity: Not on file  Transportation Needs: Not on file  Physical Activity: Not on file  Stress: Not on file  Social Connections: Not on file  Intimate Partner Violence: Not on file   Past Surgical History:  Procedure Laterality Date   BACK SURGERY  2003   Family History  Problem Relation Age of Onset   Heart disease Father    Heart attack Father 73       Deceased   Aneurysm Father    Hypertension Sister        Alive   Heart disease Maternal Grandfather 79       Deceased   Heart disease Paternal Grandmother 29       Deceased   Diabetes Other     Current Outpatient Medications:    pantoprazole (PROTONIX) 40 MG tablet,  Take 1 tablet (40 mg total) by mouth daily., Disp: 90 tablet, Rfl: 3  No Known Allergies   Review of Systems  Gastrointestinal:  Positive for heartburn.  Skin:  Positive for rash.  All other systems reviewed and are negative.    BP 121/77   Pulse 78   Temp 98.6 F (37 C)   Ht '6\' 2"'  (1.88 m)   Wt 252 lb (114.3 kg)   SpO2 96%   BMI 32.35 kg/m   Physical Exam Vitals and nursing note reviewed.  Constitutional:      General: He is not in acute distress.    Appearance: Normal appearance. He is obese. He is not ill-appearing, toxic-appearing or diaphoretic.  HENT:     Head: Normocephalic and atraumatic.     Right Ear: Tympanic membrane, ear canal and external ear normal.     Left Ear: Tympanic membrane, ear canal and external ear normal.     Nose: Nose normal.     Mouth/Throat:     Mouth: Mucous membranes are moist.     Pharynx: Oropharynx is clear.  Eyes:     Extraocular Movements: Extraocular movements intact.     Conjunctiva/sclera: Conjunctivae normal.     Pupils: Pupils are equal, round, and  reactive to light.  Neck:     Vascular: No carotid bruit.  Cardiovascular:     Rate and Rhythm: Normal rate and regular rhythm.     Heart sounds: Normal heart sounds. No murmur heard.    No friction rub. No gallop.  Pulmonary:     Effort: Pulmonary effort is normal.     Breath sounds: Normal breath sounds.  Abdominal:     General: Bowel sounds are normal.     Palpations: Abdomen is soft.  Musculoskeletal:     Cervical back: Normal range of motion and neck supple.     Right lower leg: No edema.     Left lower leg: No edema.  Lymphadenopathy:     Cervical: No cervical adenopathy.  Skin:    General: Skin is warm and dry.     Capillary Refill: Capillary refill takes less than 2 seconds.     Comments: Scattered scaly, well-circumscribed lesions that have a "stuck on" appearance to arms and legs  Neurological:     General: No focal deficit present.     Mental Status: He is  alert and oriented to person, place, and time.  Psychiatric:        Mood and Affect: Mood normal.        Behavior: Behavior normal.        Thought Content: Thought content normal.        Judgment: Judgment normal.      Assessment/ Plan: John Matthews was seen today for annual exam.  Diagnoses and all orders for this visit:  Annual physical exam Health maintenance discussed in detail. States Td was given 3 years ago, will request records. Declined colorectal cancer screening. Labs pending. Diet and exercise encouraged.  -     CBC with Differential/Platelet -     Lipid panel -     CMP14+EGFR -     HIV Antibody (routine testing w rflx) -     Hepatitis C antibody -     Thyroid Panel With TSH -     Bayer DCA Hb A1c Waived  Screening for lipid disorders -     Lipid panel  Screening for endocrine disorder -     CMP14+EGFR -     Thyroid Panel With TSH -     Bayer DCA Hb A1c Waived  Screening for HIV without presence of risk factors -     HIV Antibody (routine testing w rflx)  Encounter for hepatitis C screening test for low risk patient -     Hepatitis C antibody  Screening for diabetes mellitus -     CMP14+EGFR -     Bayer DCA Hb A1c Waived  Seborrheic keratosis CeraVe Rough and Bumpy body wash and lotion recommended.   Gastroesophageal reflux disease without esophagitis No red flags present. Diet discussed. Avoid fried, spicy, fatty, greasy, and acidic foods. Avoid caffeine, nicotine, and alcohol. Do not eat 2-3 hours before bedtime and stay upright for at least 1-2 hours after eating. Eat small frequent meals. Avoid NSAID's like motrin and aleve. Medications as prescribed. Report any new or worsening symptoms. Follow up as discussed or sooner if needed.   -     pantoprazole (PROTONIX) 40 MG tablet; Take 1 tablet (40 mg total) by mouth daily.      Counseled on healthy lifestyle choices, including diet (rich in fruits, vegetables and lean meats and low in salt and simple  carbohydrates) and exercise (at least 30 minutes of moderate physical activity daily).  Patient to follow up in 1 year for annual exam or sooner if needed.  The above assessment and management plan was discussed with the patient. The patient verbalized understanding of and has agreed to the management plan. Patient is aware to call the clinic if symptoms persist or worsen. Patient is aware when to return to the clinic for a follow-up visit. Patient educated on when it is appropriate to go to the emergency department.   Monia Pouch, FNP-C [facility] [address] [place], [place] [address] [phone]

## 2022-06-30 NOTE — Patient Instructions (Addendum)
CeraVe Rough and Bumpy body wash and lotion.

## 2022-07-01 LAB — HIV ANTIBODY (ROUTINE TESTING W REFLEX): HIV Screen 4th Generation wRfx: NONREACTIVE

## 2022-07-01 LAB — CBC WITH DIFFERENTIAL/PLATELET
Basophils Absolute: 0.1 10*3/uL (ref 0.0–0.2)
Basos: 1 %
EOS (ABSOLUTE): 0.4 10*3/uL (ref 0.0–0.4)
Eos: 5 %
Hematocrit: 48.5 % (ref 37.5–51.0)
Hemoglobin: 16.9 g/dL (ref 13.0–17.7)
Immature Grans (Abs): 0.1 10*3/uL (ref 0.0–0.1)
Immature Granulocytes: 1 %
Lymphocytes Absolute: 3.3 10*3/uL — ABNORMAL HIGH (ref 0.7–3.1)
Lymphs: 37 %
MCH: 30.3 pg (ref 26.6–33.0)
MCHC: 34.8 g/dL (ref 31.5–35.7)
MCV: 87 fL (ref 79–97)
Monocytes Absolute: 0.5 10*3/uL (ref 0.1–0.9)
Monocytes: 6 %
Neutrophils Absolute: 4.5 10*3/uL (ref 1.4–7.0)
Neutrophils: 50 %
Platelets: 287 10*3/uL (ref 150–450)
RBC: 5.57 x10E6/uL (ref 4.14–5.80)
RDW: 12.8 % (ref 11.6–15.4)
WBC: 8.8 10*3/uL (ref 3.4–10.8)

## 2022-07-01 LAB — CMP14+EGFR
ALT: 32 IU/L (ref 0–44)
AST: 20 IU/L (ref 0–40)
Albumin/Globulin Ratio: 1.8 (ref 1.2–2.2)
Albumin: 4.7 g/dL (ref 4.1–5.1)
Alkaline Phosphatase: 111 IU/L (ref 44–121)
BUN/Creatinine Ratio: 21 — ABNORMAL HIGH (ref 9–20)
BUN: 21 mg/dL (ref 6–24)
Bilirubin Total: 0.2 mg/dL (ref 0.0–1.2)
CO2: 25 mmol/L (ref 20–29)
Calcium: 10 mg/dL (ref 8.7–10.2)
Chloride: 102 mmol/L (ref 96–106)
Creatinine, Ser: 1.01 mg/dL (ref 0.76–1.27)
Globulin, Total: 2.6 g/dL (ref 1.5–4.5)
Glucose: 88 mg/dL (ref 70–99)
Potassium: 4.5 mmol/L (ref 3.5–5.2)
Sodium: 141 mmol/L (ref 134–144)
Total Protein: 7.3 g/dL (ref 6.0–8.5)
eGFR: 92 mL/min/{1.73_m2} (ref >59)

## 2022-07-01 LAB — LIPID PANEL
Chol/HDL Ratio: 5.1 ratio — ABNORMAL HIGH (ref 0.0–5.0)
Cholesterol, Total: 188 mg/dL (ref 100–199)
HDL: 37 mg/dL — ABNORMAL LOW (ref >39)
LDL Chol Calc (NIH): 123 mg/dL — ABNORMAL HIGH (ref 0–99)
Triglycerides: 155 mg/dL — ABNORMAL HIGH (ref 0–149)
VLDL Cholesterol Cal: 28 mg/dL (ref 5–40)

## 2022-07-01 LAB — THYROID PANEL WITH TSH
Free Thyroxine Index: 1.8 (ref 1.2–4.9)
T3 Uptake Ratio: 21 % — ABNORMAL LOW (ref 24–39)
T4, Total: 8.8 ug/dL (ref 4.5–12.0)
TSH: 0.99 u[IU]/mL (ref 0.450–4.500)

## 2022-07-01 LAB — HEPATITIS C ANTIBODY: Hep C Virus Ab: NONREACTIVE

## 2022-07-08 ENCOUNTER — Other Ambulatory Visit (HOSPITAL_COMMUNITY): Payer: Self-pay

## 2022-07-23 ENCOUNTER — Other Ambulatory Visit (HOSPITAL_COMMUNITY): Payer: Self-pay

## 2022-07-28 ENCOUNTER — Other Ambulatory Visit (HOSPITAL_COMMUNITY): Payer: Self-pay

## 2022-07-28 ENCOUNTER — Other Ambulatory Visit: Payer: Self-pay | Admitting: Family Medicine

## 2022-07-28 DIAGNOSIS — K219 Gastro-esophageal reflux disease without esophagitis: Secondary | ICD-10-CM

## 2022-07-28 MED ORDER — PANTOPRAZOLE SODIUM 40 MG PO TBEC
40.0000 mg | DELAYED_RELEASE_TABLET | Freq: Every day | ORAL | 3 refills | Status: DC
  Filled 2022-07-28: qty 90, 90d supply, fill #0
  Filled 2022-12-02: qty 90, 90d supply, fill #1
  Filled 2023-03-23: qty 90, 90d supply, fill #2

## 2022-08-05 ENCOUNTER — Other Ambulatory Visit (HOSPITAL_COMMUNITY): Payer: Self-pay

## 2022-09-17 ENCOUNTER — Emergency Department (HOSPITAL_COMMUNITY): Payer: No Typology Code available for payment source

## 2022-09-17 ENCOUNTER — Emergency Department (HOSPITAL_COMMUNITY)
Admission: EM | Admit: 2022-09-17 | Discharge: 2022-09-17 | Disposition: A | Discharge status: Home / Self Care | Payer: No Typology Code available for payment source | Attending: Emergency Medicine | Admitting: Emergency Medicine

## 2022-09-17 ENCOUNTER — Encounter (HOSPITAL_COMMUNITY): Payer: Self-pay | Admitting: Emergency Medicine

## 2022-09-17 DIAGNOSIS — R109 Unspecified abdominal pain: Secondary | ICD-10-CM | POA: Diagnosis present

## 2022-09-17 DIAGNOSIS — N200 Calculus of kidney: Secondary | ICD-10-CM

## 2022-09-17 DIAGNOSIS — N132 Hydronephrosis with renal and ureteral calculous obstruction: Secondary | ICD-10-CM | POA: Insufficient documentation

## 2022-09-17 LAB — CBC WITH DIFFERENTIAL/PLATELET
Abs Immature Granulocytes: 0.06 10*3/uL (ref 0.00–0.07)
Basophils Absolute: 0.1 10*3/uL (ref 0.0–0.1)
Basophils Relative: 1 %
Eosinophils Absolute: 0.5 10*3/uL (ref 0.0–0.5)
Eosinophils Relative: 5 %
HCT: 46.6 % (ref 39.0–52.0)
Hemoglobin: 15.8 g/dL (ref 13.0–17.0)
Immature Granulocytes: 1 %
Lymphocytes Relative: 35 %
Lymphs Abs: 3.5 10*3/uL (ref 0.7–4.0)
MCH: 30.8 pg (ref 26.0–34.0)
MCHC: 33.9 g/dL (ref 30.0–36.0)
MCV: 90.8 fL (ref 80.0–100.0)
Monocytes Absolute: 0.7 10*3/uL (ref 0.1–1.0)
Monocytes Relative: 7 %
Neutro Abs: 5 10*3/uL (ref 1.7–7.7)
Neutrophils Relative %: 51 %
Platelets: 253 10*3/uL (ref 150–400)
RBC: 5.13 MIL/uL (ref 4.22–5.81)
RDW: 12.7 % (ref 11.5–15.5)
WBC: 9.8 10*3/uL (ref 4.0–10.5)
nRBC: 0 % (ref 0.0–0.2)

## 2022-09-17 LAB — COMPREHENSIVE METABOLIC PANEL
ALT: 39 U/L (ref 0–44)
AST: 22 U/L (ref 15–41)
Albumin: 3.9 g/dL (ref 3.5–5.0)
Alkaline Phosphatase: 86 U/L (ref 38–126)
Anion gap: 6 (ref 5–15)
BUN: 22 mg/dL — ABNORMAL HIGH (ref 6–20)
CO2: 27 mmol/L (ref 22–32)
Calcium: 9.4 mg/dL (ref 8.9–10.3)
Chloride: 105 mmol/L (ref 98–111)
Creatinine, Ser: 1.56 mg/dL — ABNORMAL HIGH (ref 0.61–1.24)
GFR, Estimated: 54 mL/min — ABNORMAL LOW (ref >60)
Glucose, Bld: 134 mg/dL — ABNORMAL HIGH (ref 70–99)
Potassium: 4.3 mmol/L (ref 3.5–5.1)
Sodium: 138 mmol/L (ref 135–145)
Total Bilirubin: 0.3 mg/dL (ref 0.3–1.2)
Total Protein: 6.9 g/dL (ref 6.5–8.1)

## 2022-09-17 LAB — URINALYSIS, ROUTINE W REFLEX MICROSCOPIC
Bacteria, UA: NONE SEEN
Bilirubin Urine: NEGATIVE
Glucose, UA: NEGATIVE mg/dL
Ketones, ur: 5 mg/dL — AB
Leukocytes,Ua: NEGATIVE
Nitrite: NEGATIVE
Protein, ur: NEGATIVE mg/dL
Specific Gravity, Urine: 1.021 (ref 1.005–1.030)
pH: 5 (ref 5.0–8.0)

## 2022-09-17 LAB — LIPASE, BLOOD: Lipase: 28 U/L (ref 11–51)

## 2022-09-17 MED ORDER — HYDROMORPHONE HCL 1 MG/ML IJ SOLN
1.0000 mg | Freq: Once | INTRAMUSCULAR | Status: AC | PRN
  Administered 2022-09-17: 1 mg via INTRAVENOUS
  Filled 2022-09-17: qty 1

## 2022-09-17 MED ORDER — OXYCODONE-ACETAMINOPHEN 5-325 MG PO TABS: 1.0000 | ORAL_TABLET | Freq: Four times a day (QID) | ORAL | 0 refills | Status: DC | PRN

## 2022-09-17 MED ORDER — ONDANSETRON HCL 4 MG PO TABS: 4.0000 mg | ORAL_TABLET | Freq: Three times a day (TID) | ORAL | 0 refills | Status: DC | PRN

## 2022-09-17 MED ORDER — ONDANSETRON HCL 4 MG/2ML IJ SOLN
4.0000 mg | Freq: Once | INTRAMUSCULAR | Status: AC
  Administered 2022-09-17: 4 mg via INTRAVENOUS
  Filled 2022-09-17: qty 2

## 2022-09-17 MED ORDER — TAMSULOSIN HCL 0.4 MG PO CAPS: 0.4000 mg | ORAL_CAPSULE | Freq: Every day | ORAL | 0 refills | Status: DC

## 2022-09-17 MED ORDER — PROMETHAZINE HCL 25 MG/ML IJ SOLN
INTRAMUSCULAR | Status: AC
  Filled 2022-09-17: qty 1

## 2022-09-17 MED ORDER — KETOROLAC TROMETHAMINE 30 MG/ML IJ SOLN
15.0000 mg | Freq: Once | INTRAMUSCULAR | Status: AC
  Administered 2022-09-17: 15 mg via INTRAVENOUS
  Filled 2022-09-17: qty 1

## 2022-09-17 MED ORDER — OXYCODONE-ACETAMINOPHEN 5-325 MG PO TABS: 2.0000 | ORAL_TABLET | ORAL | 0 refills | Status: DC | PRN

## 2022-09-17 MED ORDER — PROMETHAZINE HCL 25 MG PO TABS: 25.0000 mg | ORAL_TABLET | Freq: Four times a day (QID) | ORAL | 0 refills | Status: DC | PRN

## 2022-09-17 MED ORDER — LACTATED RINGERS IV BOLUS
1000.0000 mL | Freq: Once | INTRAVENOUS | Status: AC
  Administered 2022-09-17: 1000 mL via INTRAVENOUS

## 2022-09-17 MED ORDER — OXYCODONE-ACETAMINOPHEN 5-325 MG PO TABS: 2.0000 | ORAL_TABLET | Freq: Once | ORAL | Status: DC

## 2022-09-17 MED ORDER — TAMSULOSIN HCL 0.4 MG PO CAPS
0.4000 mg | ORAL_CAPSULE | Freq: Once | ORAL | Status: AC
  Administered 2022-09-17: 0.4 mg via ORAL
  Filled 2022-09-17: qty 1

## 2022-09-17 MED ORDER — FENTANYL CITRATE (PF) 100 MCG/2ML IJ SOLN
100.0000 ug | Freq: Once | INTRAMUSCULAR | Status: AC
  Administered 2022-09-17: 100 ug via INTRAVENOUS
  Filled 2022-09-17: qty 2

## 2022-09-17 MED ORDER — SODIUM CHLORIDE 0.9 % IV SOLN
25.0000 mg | Freq: Once | INTRAVENOUS | Status: AC
  Administered 2022-09-17: 25 mg via INTRAVENOUS
  Filled 2022-09-17: qty 1

## 2022-09-17 NOTE — ED Triage Notes (Signed)
Pt c/o sudden right sided flank pain that radiates around to lower abd. Pt states that woke him up this morning. Pt c/o nausea when pain level spike.

## 2022-09-17 NOTE — ED Provider Notes (Signed)
[facility] EMERGENCY DEPARTMENT Provider Note   CSN: [id number] Arrival date & time: 09/17/22  0202     History  Chief Complaint  Patient presents with   Flank Pain    John Matthews is a 51 y.o. male.  51 year old male that woke up in the middle of the night with sharp stabbing pain in his right back.  Patient states on the way here it radiated around to his right lower abdomen.  States since he has been weight and it has seemed to migrate up towards the center of his abdomen.  The pain gets so bad he is nauseous but has not vomited.  Never had a kidney stone.  Has not noticed any changes in his urine.  No fevers.  No trauma.  No sick contacts.  Was fine before he went to bed last night.  No diarrhea.  Drinks a little bit but no history of alcoholism or liver problems.  No history of gallbladder or pancreas problems.   Flank Pain       Home Medications Prior to Admission medications   Medication Sig Start Date End Date Taking? Authorizing Provider  ondansetron (ZOFRAN) 4 MG tablet Take 1 tablet (4 mg total) by mouth every 8 (eight) hours as needed for nausea or vomiting. 09/17/22  Yes Marijah Larranaga, Corene Cornea, MD  oxyCODONE-acetaminophen (PERCOCET) 5-325 MG tablet Take 2 tablets by mouth every 4 (four) hours as needed. 09/17/22  Yes Kaylina Cahue, Corene Cornea, MD  oxyCODONE-acetaminophen (PERCOCET/ROXICET) 5-325 MG tablet Take 1 tablet by mouth every 6 (six) hours as needed for severe pain. 09/17/22  Yes Royale Lennartz, Corene Cornea, MD  promethazine (PHENERGAN) 25 MG tablet Take 1 tablet (25 mg total) by mouth every 6 (six) hours as needed for nausea or vomiting. 09/17/22  Yes Cevin Rubinstein, Corene Cornea, MD  tamsulosin (FLOMAX) 0.4 MG CAPS capsule Take 1 capsule (0.4 mg total) by mouth daily. Until stone passes 09/17/22  Yes Tate Zagal, Corene Cornea, MD  pantoprazole (PROTONIX) 40 MG tablet Take 1 tablet (40 mg total) by mouth daily. 07/28/22   Baruch Gouty, FNP      Allergies    Patient has no known allergies.    Review of  Systems   Review of Systems  Genitourinary:  Positive for flank pain.    Physical Exam Updated Vital Signs BP (!) 149/88   Pulse 65   Temp 97.7 F (36.5 C) (Oral)   Resp 15   Ht 6\' 2"  (1.88 m)   Wt 114.3 kg   SpO2 99%   BMI 32.35 kg/m  Physical Exam Vitals and nursing note reviewed.  Constitutional:      Appearance: He is well-developed.  HENT:     Head: Normocephalic and atraumatic.     Mouth/Throat:     Mouth: Mucous membranes are moist.  Eyes:     Pupils: Pupils are equal, round, and reactive to light.  Cardiovascular:     Rate and Rhythm: Normal rate.  Pulmonary:     Effort: Pulmonary effort is normal. No respiratory distress.  Abdominal:     General: Abdomen is flat. There is no distension.     Tenderness: There is abdominal tenderness (mild in the right upper and lower abdomen, none on the left.  No rebound or guarding.  No peritonitis.).  Musculoskeletal:        General: Normal range of motion.     Cervical back: Normal range of motion.  Skin:    General: Skin is warm and dry.  Neurological:  General: No focal deficit present.     Mental Status: He is alert. Mental status is at baseline.     ED Results / Procedures / Treatments   Labs (all labs ordered are listed, but only abnormal results are displayed) Labs Reviewed  COMPREHENSIVE METABOLIC PANEL - Abnormal; Notable for the following components:      Result Value   Glucose, Bld 134 (*)    BUN 22 (*)    Creatinine, Ser 1.56 (*)    GFR, Estimated 54 (*)    All other components within normal limits  URINALYSIS, ROUTINE W REFLEX MICROSCOPIC - Abnormal; Notable for the following components:   Hgb urine dipstick MODERATE (*)    Ketones, ur 5 (*)    All other components within normal limits  CBC WITH DIFFERENTIAL/PLATELET  LIPASE, BLOOD    EKG None  Radiology CT Renal Stone Study  Result Date: 09/17/2022 CLINICAL DATA:  Flank pain. EXAM: CT ABDOMEN AND PELVIS WITHOUT CONTRAST TECHNIQUE:  Multidetector CT imaging of the abdomen and pelvis was performed following the standard protocol without IV contrast. RADIATION DOSE REDUCTION: This exam was performed according to the departmental dose-optimization program which includes automated exposure control, adjustment of the mA and/or kV according to patient size and/or use of iterative reconstruction technique. COMPARISON:  None Available. FINDINGS: Evaluation of this exam is limited in the absence of intravenous contrast. Lower chest: The visualized lung bases are clear. No intra-abdominal free air or free fluid. Hepatobiliary: The liver is unremarkable. No biliary ductal dilatation. The gallbladder is unremarkable. Pancreas: Unremarkable. No pancreatic ductal dilatation or surrounding inflammatory changes. Spleen: Normal in size without focal abnormality. Adrenals/Urinary Tract: The adrenal glands are unremarkable. There is a 2 mm stone in the proximal right ureter with mild right hydronephrosis. 2 mm nonobstructing right renal upper pole calculus also noted. The left kidney is unremarkable. The urinary bladder is collapsed. Stomach/Bowel: No bowel obstruction or active inflammation. The appendix is normal. Vascular/Lymphatic: Mild aortoiliac atherosclerotic disease. The IVC is unremarkable. No portal venous gas. There is no adenopathy. Reproductive: The prostate and seminal vesicles are grossly unremarkable. No pelvic mass. Other: Small fat containing umbilical hernia. Musculoskeletal: Lower lumbar disc desiccation and vacuum phenomena. No acute osseous pathology. IMPRESSION: 1. A 2 mm proximal right ureteral stone with mild right hydronephrosis. 2. A 2 mm nonobstructing right renal upper pole calculus. 3. No bowel obstruction. Normal appendix. 4.  Aortic Atherosclerosis (ICD10-I70.0). Electronically Signed   By: Anner Crete M.D.   On: 09/17/2022 03:18    Procedures Procedures    Medications Ordered in ED Medications   oxyCODONE-acetaminophen (PERCOCET/ROXICET) 5-325 MG per tablet 2 tablet (2 tablets Oral Patient Refused/Not Given 09/17/22 0518)  lactated ringers bolus 1,000 mL (0 mLs Intravenous Stopped 09/17/22 0443)  fentaNYL (SUBLIMAZE) injection 100 mcg (100 mcg Intravenous Given 09/17/22 0234)  ondansetron (ZOFRAN) injection 4 mg (4 mg Intravenous Given 09/17/22 0234)  HYDROmorphone (DILAUDID) injection 1 mg (1 mg Intravenous Given 09/17/22 0312)  ketorolac (TORADOL) 30 MG/ML injection 15 mg (15 mg Intravenous Given 09/17/22 0331)  tamsulosin (FLOMAX) capsule 0.4 mg (0.4 mg Oral Given 09/17/22 0331)  lactated ringers bolus 1,000 mL (0 mLs Intravenous Stopped 09/17/22 0443)  promethazine (PHENERGAN) 25 mg in sodium chloride 0.9 % 50 mL IVPB (0 mg Intravenous Stopped 09/17/22 0502)    ED Course/ Medical Decision Making/ A&P  Medical Decision Making Amount and/or Complexity of Data Reviewed Labs: ordered. Radiology: ordered.  Risk Prescription drug management.  Does have microscopic hematuria along with a normal white blood cell count so we will get a stone study.  Also consider appendicitis versus cholecystitis versus pancreatitis however seem less likely with his presentation.  We will treat symptomatically until we get a diagnosis. CT scan viewed and interpreted by myself with right sided kidney stone, radiologist report reviewed as well. Working on symptom control.   Eventually symptoms significantly improved. Discussed with patient and wife what to do at home and reasons to return or follow up with urology. Rx provided. Stable for discharge.    Final Clinical Impression(s) / ED Diagnoses Final diagnoses:  Kidney stone    Rx / DC Orders ED Discharge Orders          Ordered    oxyCODONE-acetaminophen (PERCOCET/ROXICET) 5-325 MG tablet  Every 6 hours PRN        09/17/22 0517    ondansetron (ZOFRAN) 4 MG tablet  Every 8 hours PRN        09/17/22 0517     oxyCODONE-acetaminophen (PERCOCET) 5-325 MG tablet  Every 4 hours PRN        09/17/22 0518    promethazine (PHENERGAN) 25 MG tablet  Every 6 hours PRN        09/17/22 0518    tamsulosin (FLOMAX) 0.4 MG CAPS capsule  Daily        09/17/22 0518              Memorie Yokoyama, Barbara Cower, MD 09/17/22 1572

## 2022-09-18 MED FILL — Ondansetron HCl Tab 4 MG: ORAL | Qty: 4 | Status: AC

## 2022-09-18 MED FILL — Oxycodone w/ Acetaminophen Tab 5-325 MG: ORAL | Qty: 6 | Status: AC

## 2022-09-21 ENCOUNTER — Encounter (HOSPITAL_COMMUNITY): Payer: Self-pay | Admitting: Emergency Medicine

## 2022-09-21 ENCOUNTER — Emergency Department (HOSPITAL_COMMUNITY)
Admission: EM | Admit: 2022-09-21 | Discharge: 2022-09-21 | Disposition: A | Discharge status: Home / Self Care | Payer: No Typology Code available for payment source | Attending: Emergency Medicine | Admitting: Emergency Medicine

## 2022-09-21 ENCOUNTER — Other Ambulatory Visit: Payer: Self-pay

## 2022-09-21 DIAGNOSIS — R1031 Right lower quadrant pain: Secondary | ICD-10-CM | POA: Diagnosis present

## 2022-09-21 DIAGNOSIS — N201 Calculus of ureter: Secondary | ICD-10-CM | POA: Diagnosis not present

## 2022-09-21 DIAGNOSIS — N23 Unspecified renal colic: Secondary | ICD-10-CM

## 2022-09-21 LAB — URINALYSIS, ROUTINE W REFLEX MICROSCOPIC
Bacteria, UA: NONE SEEN
Bilirubin Urine: NEGATIVE
Glucose, UA: NEGATIVE mg/dL
Hgb urine dipstick: NEGATIVE
Ketones, ur: 5 mg/dL — AB
Leukocytes,Ua: NEGATIVE
Nitrite: NEGATIVE
Protein, ur: 30 mg/dL — AB
Specific Gravity, Urine: 1.02 (ref 1.005–1.030)
pH: 6 (ref 5.0–8.0)

## 2022-09-21 LAB — BASIC METABOLIC PANEL
Anion gap: 8 (ref 5–15)
BUN: 19 mg/dL (ref 6–20)
CO2: 25 mmol/L (ref 22–32)
Calcium: 9.1 mg/dL (ref 8.9–10.3)
Chloride: 105 mmol/L (ref 98–111)
Creatinine, Ser: 1.56 mg/dL — ABNORMAL HIGH (ref 0.61–1.24)
GFR, Estimated: 54 mL/min — ABNORMAL LOW (ref >60)
Glucose, Bld: 125 mg/dL — ABNORMAL HIGH (ref 70–99)
Potassium: 3.8 mmol/L (ref 3.5–5.1)
Sodium: 138 mmol/L (ref 135–145)

## 2022-09-21 MED ORDER — KETOROLAC TROMETHAMINE 10 MG PO TABS: 10.0000 mg | ORAL_TABLET | Freq: Four times a day (QID) | ORAL | 0 refills | Status: DC | PRN

## 2022-09-21 MED ORDER — OXYCODONE-ACETAMINOPHEN 5-325 MG PO TABS: 1.0000 | ORAL_TABLET | ORAL | 0 refills | Status: DC | PRN

## 2022-09-21 MED ORDER — HYDROMORPHONE HCL 1 MG/ML IJ SOLN
1.0000 mg | Freq: Once | INTRAMUSCULAR | Status: AC
  Administered 2022-09-21: 1 mg via INTRAVENOUS
  Filled 2022-09-21: qty 1

## 2022-09-21 MED ORDER — KETOROLAC TROMETHAMINE 30 MG/ML IJ SOLN
30.0000 mg | Freq: Once | INTRAMUSCULAR | Status: AC
  Administered 2022-09-21: 30 mg via INTRAVENOUS
  Filled 2022-09-21: qty 1

## 2022-09-21 MED ORDER — ONDANSETRON 4 MG PO TBDP: ORAL_TABLET | ORAL | 0 refills | Status: DC

## 2022-09-21 MED ORDER — OXYCODONE-ACETAMINOPHEN 5-325 MG PO TABS
1.0000 | ORAL_TABLET | Freq: Once | ORAL | Status: AC
  Administered 2022-09-21: 1 via ORAL
  Filled 2022-09-21: qty 1

## 2022-09-21 MED ORDER — ONDANSETRON HCL 4 MG/2ML IJ SOLN
4.0000 mg | Freq: Once | INTRAMUSCULAR | Status: AC
  Administered 2022-09-21: 4 mg via INTRAVENOUS
  Filled 2022-09-21: qty 2

## 2022-09-21 NOTE — ED Provider Notes (Signed)
[facility] EMERGENCY DEPARTMENT Provider Note   CSN: [id number] Arrival date & time: 09/21/22  0443     History  Chief Complaint  Patient presents with   Flank Pain    John Matthews is a 51 y.o. male.  Patient presents to the emergency department for evaluation of right flank pain.  Patient has a known ureteral stone on the right side, previously documented by CT several days ago.  He reports his pain has intensified tonight.  Pain in the right flank radiating into the right groin area.       Home Medications Prior to Admission medications   Medication Sig Start Date End Date Taking? Authorizing Provider  ondansetron (ZOFRAN) 4 MG tablet Take 1 tablet (4 mg total) by mouth every 8 (eight) hours as needed for nausea or vomiting. 09/17/22   Mesner, Barbara Cower, MD  oxyCODONE-acetaminophen (PERCOCET) 5-325 MG tablet Take 2 tablets by mouth every 4 (four) hours as needed. 09/17/22   Mesner, Barbara Cower, MD  oxyCODONE-acetaminophen (PERCOCET/ROXICET) 5-325 MG tablet Take 1 tablet by mouth every 6 (six) hours as needed for severe pain. 09/17/22   Mesner, Barbara Cower, MD  pantoprazole (PROTONIX) 40 MG tablet Take 1 tablet (40 mg total) by mouth daily. 07/28/22   Sonny Masters, FNP  promethazine (PHENERGAN) 25 MG tablet Take 1 tablet (25 mg total) by mouth every 6 (six) hours as needed for nausea or vomiting. 09/17/22   Mesner, Barbara Cower, MD  tamsulosin (FLOMAX) 0.4 MG CAPS capsule Take 1 capsule (0.4 mg total) by mouth daily. Until stone passes 09/17/22   Mesner, Barbara Cower, MD      Allergies    Patient has no known allergies.    Review of Systems   Review of Systems  Physical Exam Updated Vital Signs BP (!) 157/97 (BP Location: Left Arm)   Pulse 99   Temp 98.4 F (36.9 C) (Oral)   Resp 18   Ht 6\' 2"  (1.88 m)   Wt 114 kg   SpO2 97%   BMI 32.27 kg/m  Physical Exam Vitals and nursing note reviewed.  Constitutional:      General: He is not in acute distress.    Appearance: He is  well-developed.  HENT:     Head: Normocephalic and atraumatic.     Mouth/Throat:     Mouth: Mucous membranes are moist.  Eyes:     General: Vision grossly intact. Gaze aligned appropriately.     Extraocular Movements: Extraocular movements intact.     Conjunctiva/sclera: Conjunctivae normal.  Cardiovascular:     Rate and Rhythm: Normal rate and regular rhythm.     Pulses: Normal pulses.     Heart sounds: Normal heart sounds, S1 normal and S2 normal. No murmur heard.    No friction rub. No gallop.  Pulmonary:     Effort: Pulmonary effort is normal. No respiratory distress.     Breath sounds: Normal breath sounds.  Abdominal:     Palpations: Abdomen is soft.     Tenderness: There is no abdominal tenderness. There is no guarding or rebound.     Hernia: No hernia is present.  Musculoskeletal:        General: No swelling.     Cervical back: Full passive range of motion without pain, normal range of motion and neck supple. No pain with movement, spinous process tenderness or muscular tenderness. Normal range of motion.     Right lower leg: No edema.     Left lower leg: No edema.  Skin:    General: Skin is warm and dry.     Capillary Refill: Capillary refill takes less than 2 seconds.     Findings: No ecchymosis, erythema, lesion or wound.  Neurological:     Mental Status: He is alert and oriented to person, place, and time.     GCS: GCS eye subscore is 4. GCS verbal subscore is 5. GCS motor subscore is 6.     Cranial Nerves: Cranial nerves 2-12 are intact.     Sensory: Sensation is intact.     Motor: Motor function is intact. No weakness or abnormal muscle tone.     Coordination: Coordination is intact.  Psychiatric:        Mood and Affect: Mood normal.        Speech: Speech normal.        Behavior: Behavior normal.     ED Results / Procedures / Treatments   Labs (all labs ordered are listed, but only abnormal results are displayed) Labs Reviewed  URINALYSIS, ROUTINE W  REFLEX MICROSCOPIC  BASIC METABOLIC PANEL    EKG None  Radiology No results found.  Procedures Procedures    Medications Ordered in ED Medications  HYDROmorphone (DILAUDID) injection 1 mg (has no administration in time range)  ondansetron (ZOFRAN) injection 4 mg (has no administration in time range)  ketorolac (TORADOL) 30 MG/ML injection 30 mg (has no administration in time range)    ED Course/ Medical Decision Making/ A&P                           Medical Decision Making Amount and/or Complexity of Data Reviewed Labs: ordered.  Risk Prescription drug management.   Patient with known renal colic.  Reviewing his imaging from previous visit, he has a presumably possible 2 mm mid ureteral stone.  Based on his pain radiation I suspect that it has become distal.  Will provide additional analgesia, follow-up with urology.  Check urinalysis to ensure there is no infection.  Reviewing records from previous visit, creatinine was 1.56, make sure that he is not experiencing any significant acute kidney injury.        Final Clinical Impression(s) / ED Diagnoses Final diagnoses:  Ureteral colic    Rx / DC Orders ED Discharge Orders          Ordered    Ambulatory referral to Urology        09/21/22 0539              Gilda Crease, MD 09/21/22 [phone]

## 2022-09-21 NOTE — ED Triage Notes (Signed)
Pt seen here Thursday for kidney stone on Right. Returns tonight for worsening pain R flank area.

## 2022-12-03 ENCOUNTER — Other Ambulatory Visit (HOSPITAL_COMMUNITY): Payer: Self-pay

## 2023-03-23 ENCOUNTER — Other Ambulatory Visit (HOSPITAL_COMMUNITY): Payer: Self-pay

## 2023-03-24 ENCOUNTER — Other Ambulatory Visit (HOSPITAL_COMMUNITY): Payer: Self-pay

## 2023-06-22 ENCOUNTER — Ambulatory Visit (INDEPENDENT_AMBULATORY_CARE_PROVIDER_SITE_OTHER): Payer: 59 | Admitting: Family Medicine

## 2023-06-22 ENCOUNTER — Other Ambulatory Visit (HOSPITAL_COMMUNITY): Payer: Self-pay

## 2023-06-22 ENCOUNTER — Encounter: Payer: Self-pay | Admitting: Family Medicine

## 2023-06-22 ENCOUNTER — Other Ambulatory Visit: Payer: Self-pay

## 2023-06-22 VITALS — BP 126/78 | HR 85 | Temp 96.3°F | Ht 74.0 in | Wt 261.2 lb

## 2023-06-22 DIAGNOSIS — R5383 Other fatigue: Secondary | ICD-10-CM | POA: Diagnosis not present

## 2023-06-22 DIAGNOSIS — R5381 Other malaise: Secondary | ICD-10-CM

## 2023-06-22 DIAGNOSIS — Z6833 Body mass index (BMI) 33.0-33.9, adult: Secondary | ICD-10-CM | POA: Diagnosis not present

## 2023-06-22 DIAGNOSIS — Z0001 Encounter for general adult medical examination with abnormal findings: Secondary | ICD-10-CM

## 2023-06-22 DIAGNOSIS — K219 Gastro-esophageal reflux disease without esophagitis: Secondary | ICD-10-CM | POA: Diagnosis not present

## 2023-06-22 DIAGNOSIS — E559 Vitamin D deficiency, unspecified: Secondary | ICD-10-CM | POA: Diagnosis not present

## 2023-06-22 DIAGNOSIS — Z0000 Encounter for general adult medical examination without abnormal findings: Secondary | ICD-10-CM

## 2023-06-22 MED ORDER — PANTOPRAZOLE SODIUM 40 MG PO TBEC
40.0000 mg | DELAYED_RELEASE_TABLET | Freq: Every day | ORAL | 3 refills | Status: DC
  Filled 2023-06-22: qty 90, 90d supply, fill #0
  Filled 2023-10-03: qty 90, 90d supply, fill #1
  Filled 2024-01-08 – 2024-01-17 (×3): qty 90, 90d supply, fill #2
  Filled 2024-04-18 – 2024-04-24 (×2): qty 90, 90d supply, fill #3

## 2023-06-22 NOTE — Progress Notes (Signed)
Complete physical exam  Patient: John Matthews   DOB: [birth year]   52 y.o. Male  MRN: [id number]  Subjective:    Chief Complaint  Patient presents with   Annual Exam    DERYAN HERIN is a 52 y.o. male who presents today for a complete physical exam. He reports consuming a general diet. The patient does not participate in regular exercise at present. He generally feels well. He reports sleeping well. He does have additional problems to discuss today. States he has ongoing malaise and fatigue over the last several months.    Most recent fall risk assessment:    06/22/2023    3:49 PM  Fall Risk   Falls in the past year? 0     Most recent depression screenings:    06/22/2023    3:49 PM 06/30/2022    3:53 PM  PHQ 2/9 Scores  PHQ - 2 Score 0 0  PHQ- 9 Score 1 0    Vision:Within last year and Dental: No current dental problems and Receives regular dental care  Patient Active Problem List   Diagnosis Date Noted   Elevated blood pressure 11/17/2011   Tobacco abuse 11/17/2011   Family history of aortic aneurysm 11/17/2011   Past Medical History:  Diagnosis Date   Generalized headaches    Hypertension    URI (upper respiratory infection)    Past Surgical History:  Procedure Laterality Date   BACK SURGERY  2003   Social History   Tobacco Use   Smoking status: Every Day    Current packs/day: 1.00    Types: Cigarettes  Substance Use Topics   Alcohol use: Yes    Comment: Occasional   Drug use: No   Social History   Socioeconomic History   Marital status: Married    Spouse name: Not on file   Number of children: Not on file   Years of education: 12th grade   Highest education level: Not on file  Occupational History    Employer: [employer]  Tobacco Use   Smoking status: Every Day    Current packs/day: 1.00    Types: Cigarettes   Smokeless tobacco: Not on file  Substance and Sexual Activity   Alcohol use: Yes    Comment: Occasional    Drug use: No   Sexual activity: Yes  Other Topics Concern   Not on file  Social History Narrative   Not on file   Social Determinants of Health   Financial Resource Strain: Not on file  Food Insecurity: Not on file  Transportation Needs: Not on file  Physical Activity: Not on file  Stress: Not on file  Social Connections: Not on file  Intimate Partner Violence: Not on file   Family Status  Relation Name Status   Mother  Alive   Father  Deceased   Sister  Alive   Brother  Alive   MGF  (Not Specified)   PGM  (Not Specified)   Other  (Not Specified)  No partnership data on file   Family History  Problem Relation Age of Onset   Heart disease Father    Heart attack Father 70       Deceased   Aneurysm Father    Hypertension Sister        Alive   Heart disease Maternal Grandfather 89       Deceased   Heart disease Paternal Grandmother 58       Deceased   Diabetes Other  No Known Allergies    Patient Care Team: Dalya Maselli, Doralee Albino, FNP as PCP - General (Family Medicine) Jonelle Sidle, MD as PCP - Cardiology (Cardiology)   Outpatient Medications Prior to Visit  Medication Sig   [DISCONTINUED] ketorolac (TORADOL) 10 MG tablet Take 1 tablet (10 mg total) by mouth every 6 (six) hours as needed.   [DISCONTINUED] ondansetron (ZOFRAN) 4 MG tablet Take 1 tablet (4 mg total) by mouth every 8 (eight) hours as needed for nausea or vomiting.   [DISCONTINUED] ondansetron (ZOFRAN-ODT) 4 MG disintegrating tablet 4mg  ODT q4 hours prn nausea/vomit   [DISCONTINUED] oxyCODONE-acetaminophen (PERCOCET) 5-325 MG tablet Take 2 tablets by mouth every 4 (four) hours as needed.   [DISCONTINUED] oxyCODONE-acetaminophen (PERCOCET) 5-325 MG tablet Take 1-2 tablets by mouth every 4 (four) hours as needed.   [DISCONTINUED] oxyCODONE-acetaminophen (PERCOCET/ROXICET) 5-325 MG tablet Take 1 tablet by mouth every 6 (six) hours as needed for severe pain.   [DISCONTINUED] pantoprazole (PROTONIX) 40 MG  tablet Take 1 tablet (40 mg total) by mouth daily.   [DISCONTINUED] promethazine (PHENERGAN) 25 MG tablet Take 1 tablet (25 mg total) by mouth every 6 (six) hours as needed for nausea or vomiting.   [DISCONTINUED] tamsulosin (FLOMAX) 0.4 MG CAPS capsule Take 1 capsule (0.4 mg total) by mouth daily. Until stone passes   No facility-administered medications prior to visit.    Review of Systems  Constitutional:  Positive for malaise/fatigue. Negative for chills, diaphoresis, fever and weight loss.  Respiratory:  Negative for cough, hemoptysis, sputum production, shortness of breath and wheezing.   Cardiovascular:  Negative for chest pain, palpitations, orthopnea, claudication, leg swelling and PND.  Gastrointestinal:  Negative for abdominal pain, blood in stool, constipation, diarrhea, heartburn, melena, nausea and vomiting.  Genitourinary:  Negative for dysuria, flank pain, frequency, hematuria and urgency.  Skin:  Negative for itching and rash.  Neurological:  Negative for dizziness, tingling, tremors, sensory change, speech change, focal weakness, seizures, loss of consciousness, weakness and headaches.  All other systems reviewed and are negative.       Objective:     BP 126/78   Pulse 85   Temp (!) 96.3 F (35.7 C) (Temporal)   Ht 6\' 2"  (1.88 m)   Wt 261 lb 3.2 oz (118.5 kg)   SpO2 96%   BMI 33.54 kg/m  BP Readings from Last 3 Encounters:  06/22/23 126/78  09/21/22 134/74  09/17/22 (!) 149/88   Wt Readings from Last 3 Encounters:  06/22/23 261 lb 3.2 oz (118.5 kg)  09/21/22 251 lb 5.2 oz (114 kg)  09/17/22 251 lb 15.8 oz (114.3 kg)   SpO2 Readings from Last 3 Encounters:  06/22/23 96%  09/21/22 97%  09/17/22 99%      Physical Exam Vitals and nursing note reviewed.  Constitutional:      General: He is not in acute distress.    Appearance: Normal appearance. He is well-developed and well-groomed. He is obese. He is not ill-appearing, toxic-appearing or  diaphoretic.  HENT:     Head: Normocephalic and atraumatic.     Jaw: There is normal jaw occlusion.     Right Ear: Hearing, tympanic membrane, ear canal and external ear normal.     Left Ear: Hearing, tympanic membrane, ear canal and external ear normal.     Nose: Nose normal.     Mouth/Throat:     Lips: Pink.     Mouth: Mucous membranes are moist.     Pharynx: Oropharynx is clear. Uvula  midline.  Eyes:     General: Lids are normal.     Extraocular Movements: Extraocular movements intact.     Conjunctiva/sclera: Conjunctivae normal.     Pupils: Pupils are equal, round, and reactive to light.  Neck:     Thyroid: No thyroid mass, thyromegaly or thyroid tenderness.     Vascular: No carotid bruit or JVD.     Trachea: Trachea and phonation normal.  Cardiovascular:     Rate and Rhythm: Normal rate and regular rhythm.     Chest Wall: PMI is not displaced.     Pulses: Normal pulses.     Heart sounds: Normal heart sounds. No murmur heard.    No friction rub. No gallop.  Pulmonary:     Effort: Pulmonary effort is normal. No respiratory distress.     Breath sounds: Normal breath sounds. No wheezing.  Abdominal:     General: Bowel sounds are normal. There is no distension or abdominal bruit.     Palpations: Abdomen is soft. There is no hepatomegaly or splenomegaly.     Tenderness: There is no abdominal tenderness. There is no right CVA tenderness or left CVA tenderness.     Hernia: No hernia is present.  Musculoskeletal:        General: Normal range of motion.     Cervical back: Normal range of motion and neck supple.     Right lower leg: No edema.     Left lower leg: No edema.  Lymphadenopathy:     Cervical: No cervical adenopathy.  Skin:    General: Skin is warm and dry.     Capillary Refill: Capillary refill takes less than 2 seconds.     Coloration: Skin is not cyanotic, jaundiced or pale.     Findings: No rash.  Neurological:     General: No focal deficit present.     Mental  Status: He is alert and oriented to person, place, and time.     Sensory: Sensation is intact.     Motor: Motor function is intact.     Coordination: Coordination is intact.     Gait: Gait is intact.     Deep Tendon Reflexes: Reflexes are normal and symmetric.  Psychiatric:        Attention and Perception: Attention and perception normal.        Mood and Affect: Mood and affect normal.        Speech: Speech normal.        Behavior: Behavior normal. Behavior is cooperative.        Thought Content: Thought content normal.        Cognition and Memory: Cognition and memory normal.        Judgment: Judgment normal.      Last CBC Lab Results  Component Value Date   WBC 9.8 09/17/2022   HGB 15.8 09/17/2022   HCT 46.6 09/17/2022   MCV 90.8 09/17/2022   MCH 30.8 09/17/2022   RDW 12.7 09/17/2022   PLT 253 09/17/2022   Last metabolic panel Lab Results  Component Value Date   GLUCOSE 125 (H) 09/21/2022   NA 138 09/21/2022   K 3.8 09/21/2022   CL 105 09/21/2022   CO2 25 09/21/2022   BUN 19 09/21/2022   CREATININE 1.56 (H) 09/21/2022   GFRNONAA 54 (L) 09/21/2022   CALCIUM 9.1 09/21/2022   PROT 6.9 09/17/2022   ALBUMIN 3.9 09/17/2022   LABGLOB 2.6 06/30/2022   AGRATIO 1.8 06/30/2022   BILITOT  0.3 09/17/2022   ALKPHOS 86 09/17/2022   AST 22 09/17/2022   ALT 39 09/17/2022   ANIONGAP 8 09/21/2022   Last lipids Lab Results  Component Value Date   CHOL 188 06/30/2022   HDL 37 (L) 06/30/2022   LDLCALC 123 (H) 06/30/2022   TRIG 155 (H) 06/30/2022   CHOLHDL 5.1 (H) 06/30/2022   Last hemoglobin A1c Lab Results  Component Value Date   HGBA1C 5.6 06/30/2022   Last thyroid functions Lab Results  Component Value Date   TSH 0.990 06/30/2022   T4TOTAL 8.8 06/30/2022       Assessment & Plan:    Routine Health Maintenance and Physical Exam   There is no immunization history on file for this patient.  Health Maintenance  Topic Date Due   DTaP/Tdap/Td (1 - Tdap) Never  done   Colonoscopy  07/01/2023 (Originally 08/29/2018)   Hepatitis C Screening  Completed   HIV Screening  Completed   HPV VACCINES  Aged Out   INFLUENZA VACCINE  Discontinued   COVID-19 Vaccine  Discontinued    Discussed health benefits of physical activity, and encouraged him to engage in regular exercise appropriate for his age and condition.  Problem List Items Addressed This Visit   None Visit Diagnoses     Annual physical exam    -  Primary   Relevant Orders   CMP14+EGFR   CBC with Differential/Platelet   Testosterone,Free and Total   Lipid panel   Thyroid Panel With TSH   VITAMIN D 25 Hydroxy (Vit-D Deficiency, Fractures)   Malaise and fatigue       Relevant Orders   CMP14+EGFR   CBC with Differential/Platelet   Testosterone,Free and Total   Thyroid Panel With TSH   VITAMIN D 25 Hydroxy (Vit-D Deficiency, Fractures)   BMI 33.0-33.9,adult       Relevant Orders   CMP14+EGFR   CBC with Differential/Platelet   Lipid panel   Thyroid Panel With TSH   VITAMIN D 25 Hydroxy (Vit-D Deficiency, Fractures)   Vitamin D deficiency       Relevant Orders   VITAMIN D 25 Hydroxy (Vit-D Deficiency, Fractures)   Gastroesophageal reflux disease without esophagitis       Relevant Medications   pantoprazole (PROTONIX) 40 MG tablet     Declined Tdap and colorectal cancer screening.  The above assessment and management plan was discussed with the patient. The patient verbalized understanding of and has agreed to the management plan. Patient is aware to call the clinic if they develop any new symptoms or if symptoms fail to improve or worsen. Patient is aware when to return to the clinic for a follow-up visit. Patient educated on when it is appropriate to go to the emergency department.   Return in about 1 year (around 06/21/2024) for CPE.  Kari Baars, FNP-C [facility] [address] [place], [place] [address] [phone]

## 2023-06-23 ENCOUNTER — Other Ambulatory Visit (HOSPITAL_COMMUNITY): Payer: Self-pay

## 2023-08-31 ENCOUNTER — Other Ambulatory Visit: Payer: Self-pay

## 2023-08-31 ENCOUNTER — Encounter (HOSPITAL_COMMUNITY): Payer: Self-pay

## 2023-08-31 ENCOUNTER — Emergency Department (HOSPITAL_COMMUNITY)
Admission: EM | Admit: 2023-08-31 | Discharge: 2023-08-31 | Discharge status: Against Medical Advice | Payer: 59 | Attending: Emergency Medicine | Admitting: Emergency Medicine

## 2023-08-31 ENCOUNTER — Emergency Department (HOSPITAL_COMMUNITY): Payer: 59

## 2023-08-31 DIAGNOSIS — R42 Dizziness and giddiness: Secondary | ICD-10-CM | POA: Diagnosis not present

## 2023-08-31 DIAGNOSIS — Z5321 Procedure and treatment not carried out due to patient leaving prior to being seen by health care provider: Secondary | ICD-10-CM | POA: Diagnosis not present

## 2023-08-31 DIAGNOSIS — R079 Chest pain, unspecified: Secondary | ICD-10-CM | POA: Diagnosis not present

## 2023-08-31 DIAGNOSIS — R0789 Other chest pain: Secondary | ICD-10-CM | POA: Diagnosis not present

## 2023-08-31 LAB — BASIC METABOLIC PANEL
Anion gap: 12 (ref 5–15)
BUN: 21 mg/dL — ABNORMAL HIGH (ref 6–20)
CO2: 22 mmol/L (ref 22–32)
Calcium: 9.2 mg/dL (ref 8.9–10.3)
Chloride: 104 mmol/L (ref 98–111)
Creatinine, Ser: 1.05 mg/dL (ref 0.61–1.24)
GFR, Estimated: >60 mL/min (ref >60)
Glucose, Bld: 155 mg/dL — ABNORMAL HIGH (ref 70–99)
Potassium: 3.9 mmol/L (ref 3.5–5.1)
Sodium: 138 mmol/L (ref 135–145)

## 2023-08-31 LAB — CBC
HCT: 45.7 % (ref 39.0–52.0)
Hemoglobin: 15.7 g/dL (ref 13.0–17.0)
MCH: 31.2 pg (ref 26.0–34.0)
MCHC: 34.4 g/dL (ref 30.0–36.0)
MCV: 90.7 fL (ref 80.0–100.0)
Platelets: 266 10*3/uL (ref 150–400)
RBC: 5.04 MIL/uL (ref 4.22–5.81)
RDW: 12.9 % (ref 11.5–15.5)
WBC: 10.4 10*3/uL (ref 4.0–10.5)
nRBC: 0 % (ref 0.0–0.2)

## 2023-08-31 LAB — TROPONIN I (HIGH SENSITIVITY): Troponin I (High Sensitivity): 4 ng/L (ref <18)

## 2023-08-31 MED ORDER — ONDANSETRON 4 MG PO TBDP
4.0000 mg | ORAL_TABLET | Freq: Once | ORAL | Status: AC
  Administered 2023-08-31: 4 mg via ORAL
  Filled 2023-08-31: qty 1

## 2023-08-31 MED ORDER — MECLIZINE HCL 12.5 MG PO TABS
25.0000 mg | ORAL_TABLET | Freq: Once | ORAL | Status: AC
  Administered 2023-08-31: 25 mg via ORAL
  Filled 2023-08-31: qty 2

## 2023-08-31 NOTE — ED Triage Notes (Signed)
Pt woke up from a nap 1hour ago with pain on the LEFT side of his chest that radiates down left arm  Pt complains of nausea, dizziness and SOB  Pt has hx of vertigo LEFT ear felt clogged yesterday.   Pt stated that pain in his chest is completely resolved, but feels like everything is spinning.

## 2023-08-31 NOTE — ED Notes (Signed)
Informed by registration that pt left 

## 2023-08-31 NOTE — ED Provider Triage Note (Signed)
Emergency Medicine Provider Triage Evaluation Note  John Matthews , a 52 y.o. male  was evaluated in triage.  Pt complains of dizziness,  chest pain, ear ringing. Has had ringing out of his L ear with decreased hearing for 2 days. Took a nap at 430 pm and woke up an hour later with a room spinning sensation. Also developed vomiting and sweating. Started having cp afterwards.   Review of Systems  Positive: See above Negative: See above  Physical Exam  BP (!) 136/90 (BP Location: Left Arm)   Pulse 85   Temp 97.8 F (36.6 C) (Oral)   Resp 20   Ht 6\' 2"  (1.88 m)   Wt 120.2 kg   SpO2 97%   BMI 34.02 kg/m  Gen:   Awake, no distress, having nausea and vomiting Resp:  Normal effort  MSK:   Moves extremities without difficulty  Other:  Able to walk without assistance, L beating nystagmus  Medical Decision Making  Medically screening exam initiated at 7:26 PM.  Appropriate orders placed.  John Matthews was informed that the remainder of the evaluation will be completed by another provider, this initial triage assessment does not replace that evaluation, and the importance of remaining in the ED until their evaluation is complete.   John Baton, MD 08/31/23 [phone]

## 2023-08-31 NOTE — ED Notes (Signed)
Vitals reassessed Pt MSE'ed by provider Medicated per [facility] Moved to waiting room

## 2023-09-02 ENCOUNTER — Encounter: Payer: Self-pay | Admitting: Family Medicine

## 2023-09-02 ENCOUNTER — Ambulatory Visit (INDEPENDENT_AMBULATORY_CARE_PROVIDER_SITE_OTHER): Payer: 59 | Admitting: Family Medicine

## 2023-09-02 VITALS — BP 131/83 | HR 88 | Temp 97.1°F | Ht 74.0 in | Wt 262.0 lb

## 2023-09-02 DIAGNOSIS — R42 Dizziness and giddiness: Secondary | ICD-10-CM

## 2023-09-02 DIAGNOSIS — R9431 Abnormal electrocardiogram [ECG] [EKG]: Secondary | ICD-10-CM

## 2023-09-02 DIAGNOSIS — R7309 Other abnormal glucose: Secondary | ICD-10-CM | POA: Diagnosis not present

## 2023-09-02 DIAGNOSIS — H6992 Unspecified Eustachian tube disorder, left ear: Secondary | ICD-10-CM

## 2023-09-02 DIAGNOSIS — Z6833 Body mass index (BMI) 33.0-33.9, adult: Secondary | ICD-10-CM

## 2023-09-02 DIAGNOSIS — Z720 Tobacco use: Secondary | ICD-10-CM

## 2023-09-02 LAB — BAYER DCA HB A1C WAIVED: HB A1C (BAYER DCA - WAIVED): 5.7 % — ABNORMAL HIGH (ref 4.8–5.6)

## 2023-09-02 MED ORDER — MECLIZINE HCL 25 MG PO TABS: 25.0000 mg | ORAL_TABLET | Freq: Three times a day (TID) | ORAL | 0 refills | Status: DC | PRN

## 2023-09-02 MED ORDER — FLUTICASONE PROPIONATE 50 MCG/ACT NA SUSP: 2.0000 | Freq: Every day | NASAL | 6 refills | Status: DC

## 2023-09-02 MED ORDER — CETIRIZINE HCL 10 MG PO TABS: 10.0000 mg | ORAL_TABLET | Freq: Every day | ORAL | 11 refills | Status: DC

## 2023-09-02 MED ORDER — ONDANSETRON HCL 4 MG PO TABS: 4.0000 mg | ORAL_TABLET | Freq: Three times a day (TID) | ORAL | 0 refills | Status: DC | PRN

## 2023-09-02 NOTE — Progress Notes (Signed)
Subjective: CC: ER follow-up PCP: Sonny Masters, FNP  [id number] John Matthews is a 52 y.o. male presenting to clinic today for:  ER follow-up/Dizziness Patient presented to the ER on 08/31/2023 for dizziness. He reports he took a nap after supper on Tuesday, and when he woke up, the room was spinning "like I was inside a tornado." He was unable to stand up straight or walk. He was also profusely sweating and nauseous. He was helped to the car and taken to the ER by his family. Upon arrival to the ER, an EKG and labs were completed. He was given a dose of Meclizine and sent back to the waiting room. His symptoms resolved soon thereafter, so he left AMA before being seen by a provider. He has not experienced anymore episodes of dizziness since then. He reports muffled hearing and tinnitus in his left ear since Sunday, but he denies any other symptoms or recent changes that may have contributed to the episode. He did not have chest pain or shortness of breath during the episode. He denies past episodes of vertigo.  Relevant past medical, surgical, family, and social history reviewed and updated as indicated.  Allergies and medications reviewed and updated.  No Known Allergies Past Medical History:  Diagnosis Date   Generalized headaches    Hypertension    URI (upper respiratory infection)     Current Outpatient Medications:    cetirizine (ZYRTEC) 10 MG tablet, Take 1 tablet (10 mg total) by mouth daily., Disp: 30 tablet, Rfl: 11   fluticasone (FLONASE) 50 MCG/ACT nasal spray, Place 2 sprays into both nostrils daily., Disp: 16 g, Rfl: 6   meclizine (ANTIVERT) 25 MG tablet, Take 1 tablet (25 mg total) by mouth 3 (three) times daily as needed for dizziness., Disp: 30 tablet, Rfl: 0   ondansetron (ZOFRAN) 4 MG tablet, Take 1 tablet (4 mg total) by mouth every 8 (eight) hours as needed for nausea or vomiting., Disp: 20 tablet, Rfl: 0   pantoprazole (PROTONIX) 40 MG tablet, Take 1 tablet (40 mg  total) by mouth daily., Disp: 90 tablet, Rfl: 3 Social History   Socioeconomic History   Marital status: Married    Spouse name: Not on file   Number of children: Not on file   Years of education: 12th grade   Highest education level: Not on file  Occupational History    Employer: [employer]  Tobacco Use   Smoking status: Every Day    Current packs/day: 1.00    Types: Cigarettes   Smokeless tobacco: Not on file  Substance and Sexual Activity   Alcohol use: Yes    Comment: Occasional   Drug use: No   Sexual activity: Yes  Other Topics Concern   Not on file  Social History Narrative   Not on file   Social Determinants of Health   Financial Resource Strain: Not on file  Food Insecurity: Not on file  Transportation Needs: Not on file  Physical Activity: Not on file  Stress: Not on file  Social Connections: Not on file  Intimate Partner Violence: Not on file   Family History  Problem Relation Age of Onset   Heart disease Father    Heart attack Father 12       Deceased   Aneurysm Father    Hypertension Sister        Alive   Heart disease Maternal Grandfather 60       Deceased   Heart disease Paternal Grandmother  70       Deceased   Diabetes Other     Review of Systems  Constitutional:  Positive for fatigue. Negative for appetite change, chills and fever.       Chronic fatigue, unchanged  HENT:  Positive for hearing loss and tinnitus. Negative for congestion, ear pain, sinus pressure and sore throat.   Eyes:  Negative for visual disturbance.  Respiratory:  Negative for cough, chest tightness and shortness of breath.   Cardiovascular:  Negative for chest pain and palpitations.  Gastrointestinal:  Positive for nausea. Negative for abdominal pain, blood in stool, constipation and diarrhea.       Nausea when dizzy  Endocrine: Negative for polydipsia and polyuria.  Genitourinary:  Negative for difficulty urinating, dysuria, frequency and hematuria.   Musculoskeletal:  Negative for myalgias.  Neurological:  Positive for dizziness and headaches. Negative for syncope, weakness and light-headedness.       Chronic headaches. Dizziness during the episode but not currently.     Objective: Office vital signs reviewed. BP 131/83   Pulse 88   Temp (!) 97.1 F (36.2 C) (Temporal)   Ht 6\' 2"  (1.88 m)   Wt 262 lb (118.8 kg)   SpO2 96%   BMI 33.64 kg/m   Physical Examination:  Physical Exam Vitals and nursing note reviewed.  Constitutional:      General: He is not in acute distress.    Appearance: Normal appearance. He is obese.  HENT:     Head: Normocephalic and atraumatic.     Right Ear: Tympanic membrane, ear canal and external ear normal.     Left Ear: Ear canal and external ear normal.     Ears:     Comments: Middle ear effusion on left, non-purulent and non-erythematous    Mouth/Throat:     Mouth: Mucous membranes are moist.     Pharynx: Oropharynx is clear.  Eyes:     Conjunctiva/sclera: Conjunctivae normal.  Cardiovascular:     Rate and Rhythm: Normal rate and regular rhythm.     Heart sounds: Normal heart sounds.  Pulmonary:     Effort: Pulmonary effort is normal. No respiratory distress.     Breath sounds: Normal breath sounds.  Abdominal:     General: Abdomen is flat. There is no distension.     Palpations: Abdomen is soft.     Tenderness: There is no abdominal tenderness.  Musculoskeletal:     Cervical back: Normal range of motion and neck supple. No tenderness.     Right lower leg: No edema.     Left lower leg: No edema.  Skin:    General: Skin is warm and dry.  Neurological:     Mental Status: He is alert and oriented to person, place, and time.  Psychiatric:        Mood and Affect: Mood normal.        Behavior: Behavior normal.        Thought Content: Thought content normal.        Judgment: Judgment normal.     Results for orders placed or performed during the hospital encounter of 08/31/23  Basic  metabolic panel  Result Value Ref Range   Sodium 138 135 - 145 mmol/L   Potassium 3.9 3.5 - 5.1 mmol/L   Chloride 104 98 - 111 mmol/L   CO2 22 22 - 32 mmol/L   Glucose, Bld 155 (H) 70 - 99 mg/dL   BUN 21 (H) 6 - 20 mg/dL  Creatinine, Ser 1.05 0.61 - 1.24 mg/dL   Calcium 9.2 8.9 - 19.1 mg/dL   GFR, Estimated >47 >82 mL/min   Anion gap 12 5 - 15  CBC  Result Value Ref Range   WBC 10.4 4.0 - 10.5 K/uL   RBC 5.04 4.22 - 5.81 MIL/uL   Hemoglobin 15.7 13.0 - 17.0 g/dL   HCT 95.6 21.3 - 08.6 %   MCV 90.7 80.0 - 100.0 fL   MCH 31.2 26.0 - 34.0 pg   MCHC 34.4 30.0 - 36.0 g/dL   RDW 57.8 46.9 - 62.9 %   Platelets 266 150 - 400 K/uL   nRBC 0.0 0.0 - 0.2 %  Troponin I (High Sensitivity)  Result Value Ref Range   Troponin I (High Sensitivity) 4 <18 ng/L     Assessment/ Plan: John Matthews was seen today for er follow up .  Diagnoses and all orders for this visit:  Vertigo -     Bayer DCA Hb A1c Waived -     CBC with Differential/Platelet -     CMP14+EGFR -     Thyroid Panel With TSH -     meclizine (ANTIVERT) 25 MG tablet; Take 1 tablet (25 mg total) by mouth 3 (three) times daily as needed for dizziness. -     ondansetron (ZOFRAN) 4 MG tablet; Take 1 tablet (4 mg total) by mouth every 8 (eight) hours as needed for nausea or vomiting.  Abnormal EKG -     CBC with Differential/Platelet -     CMP14+EGFR -     Lipid panel -     Thyroid Panel With TSH  Elevated glucose -     Bayer DCA Hb A1c Waived -     CMP14+EGFR -     Thyroid Panel With TSH  Tobacco abuse -     CBC with Differential/Platelet -     CMP14+EGFR -     Lipid panel -     Thyroid Panel With TSH -     Ambulatory referral to Cardiology  BMI 33.0-33.9,adult -     Bayer DCA Hb A1c Waived -     CBC with Differential/Platelet -     CMP14+EGFR -     Lipid panel -     Thyroid Panel With TSH  Eustachian tube dysfunction, left -     fluticasone (FLONASE) 50 MCG/ACT nasal spray; Place 2 sprays into both nostrils  daily. -     cetirizine (ZYRTEC) 10 MG tablet; Take 1 tablet (10 mg total) by mouth daily. -     meclizine (ANTIVERT) 25 MG tablet; Take 1 tablet (25 mg total) by mouth 3 (three) times daily as needed for dizziness. -     ondansetron (ZOFRAN) 4 MG tablet; Take 1 tablet (4 mg total) by mouth every 8 (eight) hours as needed for nausea or vomiting.   Continue all other maintenance medications.  Follow up plan: Return if symptoms worsen or fail to improve.   The above assessment and management plan was discussed with the patient. The patient verbalized understanding of and has agreed to the management plan. Patient is aware to call the clinic if symptoms persist or worsen. Patient is aware when to return to the clinic for a follow-up visit. Patient educated on when it is appropriate to go to the emergency department.   Gillermina Phy, Medical Student [facility] [address] [place], [place] [address] [phone]

## 2023-09-03 LAB — LIPID PANEL
Chol/HDL Ratio: 4.9 ratio (ref 0.0–5.0)
Cholesterol, Total: 178 mg/dL (ref 100–199)
HDL: 36 mg/dL — ABNORMAL LOW (ref >39)
LDL Chol Calc (NIH): 122 mg/dL — ABNORMAL HIGH (ref 0–99)
Triglycerides: 109 mg/dL (ref 0–149)
VLDL Cholesterol Cal: 20 mg/dL (ref 5–40)

## 2023-09-03 LAB — CBC WITH DIFFERENTIAL/PLATELET
Basophils Absolute: 0.1 10*3/uL (ref 0.0–0.2)
Basos: 1 %
EOS (ABSOLUTE): 0.4 10*3/uL (ref 0.0–0.4)
Eos: 5 %
Hematocrit: 50.1 % (ref 37.5–51.0)
Hemoglobin: 16.6 g/dL (ref 13.0–17.7)
Immature Grans (Abs): 0 10*3/uL (ref 0.0–0.1)
Immature Granulocytes: 0 %
Lymphocytes Absolute: 2.5 10*3/uL (ref 0.7–3.1)
Lymphs: 31 %
MCH: 30.5 pg (ref 26.6–33.0)
MCHC: 33.1 g/dL (ref 31.5–35.7)
MCV: 92 fL (ref 79–97)
Monocytes Absolute: 0.6 10*3/uL (ref 0.1–0.9)
Monocytes: 7 %
Neutrophils Absolute: 4.5 10*3/uL (ref 1.4–7.0)
Neutrophils: 56 %
Platelets: 284 10*3/uL (ref 150–450)
RBC: 5.45 x10E6/uL (ref 4.14–5.80)
RDW: 12.5 % (ref 11.6–15.4)
WBC: 8.1 10*3/uL (ref 3.4–10.8)

## 2023-09-03 LAB — CMP14+EGFR
ALT: 43 [IU]/L (ref 0–44)
AST: 28 [IU]/L (ref 0–40)
Albumin: 4.4 g/dL (ref 4.1–5.1)
Alkaline Phosphatase: 115 [IU]/L (ref 44–121)
BUN/Creatinine Ratio: 19 (ref 9–20)
BUN: 19 mg/dL (ref 6–24)
Bilirubin Total: 0.3 mg/dL (ref 0.0–1.2)
CO2: 25 mmol/L (ref 20–29)
Calcium: 9.8 mg/dL (ref 8.7–10.2)
Chloride: 104 mmol/L (ref 96–106)
Creatinine, Ser: 0.99 mg/dL (ref 0.76–1.27)
Globulin, Total: 2.5 g/dL (ref 1.5–4.5)
Glucose: 113 mg/dL — ABNORMAL HIGH (ref 70–99)
Potassium: 5 mmol/L (ref 3.5–5.2)
Sodium: 143 mmol/L (ref 134–144)
Total Protein: 6.9 g/dL (ref 6.0–8.5)
eGFR: 93 mL/min/{1.73_m2} (ref >59)

## 2023-09-03 LAB — THYROID PANEL WITH TSH
Free Thyroxine Index: 1.9 (ref 1.2–4.9)
T3 Uptake Ratio: 21 % — ABNORMAL LOW (ref 24–39)
T4, Total: 9 ug/dL (ref 4.5–12.0)
TSH: 0.69 u[IU]/mL (ref 0.450–4.500)

## 2023-09-08 ENCOUNTER — Ambulatory Visit: Payer: 59

## 2023-10-04 ENCOUNTER — Other Ambulatory Visit (HOSPITAL_COMMUNITY): Payer: Self-pay

## 2023-12-16 ENCOUNTER — Ambulatory Visit (INDEPENDENT_AMBULATORY_CARE_PROVIDER_SITE_OTHER): Payer: 59

## 2023-12-16 ENCOUNTER — Encounter: Payer: Self-pay | Admitting: Family Medicine

## 2023-12-16 ENCOUNTER — Ambulatory Visit: Payer: 59 | Admitting: Family Medicine

## 2023-12-16 VITALS — BP 137/84 | HR 94 | Temp 98.4°F | Ht 74.0 in | Wt 265.0 lb

## 2023-12-16 DIAGNOSIS — M5431 Sciatica, right side: Secondary | ICD-10-CM | POA: Diagnosis not present

## 2023-12-16 DIAGNOSIS — R1031 Right lower quadrant pain: Secondary | ICD-10-CM

## 2023-12-16 DIAGNOSIS — M25551 Pain in right hip: Secondary | ICD-10-CM | POA: Diagnosis not present

## 2023-12-16 MED ORDER — METHYLPREDNISOLONE ACETATE 80 MG/ML IJ SUSP
80.0000 mg | Freq: Once | INTRAMUSCULAR | Status: AC
  Administered 2023-12-16: 60 mg via INTRAMUSCULAR

## 2023-12-16 MED ORDER — CYCLOBENZAPRINE HCL 10 MG PO TABS: 10.0000 mg | ORAL_TABLET | Freq: Three times a day (TID) | ORAL | 0 refills | Status: DC | PRN

## 2023-12-16 MED ORDER — PREDNISONE 20 MG PO TABS: 40.0000 mg | ORAL_TABLET | Freq: Every day | ORAL | 0 refills | Status: AC

## 2023-12-16 NOTE — Progress Notes (Signed)
Subjective:  Patient ID: John Matthews, male    DOB: 12/04/1970, 53 y.o.   MRN: [id number]  Patient Care Team: Sonny Masters, FNP as PCP - General (Family Medicine) Jonelle Sidle, MD as PCP - Cardiology (Cardiology)   Chief Complaint:  Groin Pain (Right sided groin pain that has been on and off for a few weeks but has gotten worse this week. )   HPI: John Matthews is a 53 y.o. male presenting on 12/16/2023 for Groin Pain (Right sided groin pain that has been on and off for a few weeks but has gotten worse this week. )   Discussed the use of AI scribe software for clinical note transcription with the patient, who gave verbal consent to proceed.  History of Present Illness   John Matthews is a 53 year old male who presents with leg pain radiating from the hip to the foot.  He has been experiencing leg pain for a couple of weeks, described as a dull throb radiating from the hip down the front of the leg to the top of the foot. The pain intensifies when sitting, particularly in the evenings, and subsides upon standing. Recently, the pain has been severe enough to disrupt his sleep, keeping him awake all night on one occasion. No back pain is reported, although he had back surgery approximately 25 years ago.  He describes a throbbing sensation in the area of pain, feeling like his heartbeat. He denies any recent injuries or trauma to the area.  He has attempted various treatments including ice packs, heating pads, Biofreeze, and hot showers, none of which have provided relief. Over-the-counter medications such as Motrin and Tylenol have also been ineffective. He mentions taking 'two goodies' on the way to the appointment, despite known stomach issues with such medications.  He recalls a past incident around Christmas when he slipped while stepping onto a deck, causing temporary discomfort the following day, but no immediate pain at the time.          Relevant  past medical, surgical, family, and social history reviewed and updated as indicated.  Allergies and medications reviewed and updated. Data reviewed: Chart in Epic.   Past Medical History:  Diagnosis Date   Generalized headaches    Hypertension    URI (upper respiratory infection)     Past Surgical History:  Procedure Laterality Date   BACK SURGERY  2003    Social History   Socioeconomic History   Marital status: Married    Spouse name: Not on file   Number of children: Not on file   Years of education: 12th grade   Highest education level: Not on file  Occupational History    Employer: [employer]  Tobacco Use   Smoking status: Every Day    Current packs/day: 1.00    Types: Cigarettes   Smokeless tobacco: Not on file  Substance and Sexual Activity   Alcohol use: Yes    Comment: Occasional   Drug use: No   Sexual activity: Yes  Other Topics Concern   Not on file  Social History Narrative   Not on file   Social Drivers of Health   Financial Resource Strain: Not on file  Food Insecurity: Not on file  Transportation Needs: Not on file  Physical Activity: Not on file  Stress: Not on file  Social Connections: Not on file  Intimate Partner Violence: Not on file    Outpatient Encounter Medications as  of 12/16/2023  Medication Sig   cetirizine (ZYRTEC) 10 MG tablet Take 1 tablet (10 mg total) by mouth daily.   cyclobenzaprine (FLEXERIL) 10 MG tablet Take 1 tablet (10 mg total) by mouth 3 (three) times daily as needed for muscle spasms.   fluticasone (FLONASE) 50 MCG/ACT nasal spray Place 2 sprays into both nostrils daily.   meclizine (ANTIVERT) 25 MG tablet Take 1 tablet (25 mg total) by mouth 3 (three) times daily as needed for dizziness.   ondansetron (ZOFRAN) 4 MG tablet Take 1 tablet (4 mg total) by mouth every 8 (eight) hours as needed for nausea or vomiting.   pantoprazole (PROTONIX) 40 MG tablet Take 1 tablet (40 mg total) by mouth daily.   predniSONE  (DELTASONE) 20 MG tablet Take 2 tablets (40 mg total) by mouth daily with breakfast for 5 days.   [EXPIRED] methylPREDNISolone acetate (DEPO-MEDROL) injection 80 mg    No facility-administered encounter medications on file as of 12/16/2023.    No Known Allergies  Pertinent ROS per HPI, otherwise unremarkable      Objective:  BP 137/84   Pulse 94   Temp 98.4 F (36.9 C)   Ht 6\' 2"  (1.88 m)   Wt 265 lb (120.2 kg)   SpO2 95%   BMI 34.02 kg/m    Wt Readings from Last 3 Encounters:  12/16/23 265 lb (120.2 kg)  09/02/23 262 lb (118.8 kg)  08/31/23 265 lb (120.2 kg)    Physical Exam Vitals and nursing note reviewed.  Constitutional:      General: He is not in acute distress.    Appearance: Normal appearance. He is obese. He is not ill-appearing, toxic-appearing or diaphoretic.  HENT:     Head: Normocephalic and atraumatic.     Nose: Nose normal.     Mouth/Throat:     Mouth: Mucous membranes are moist.  Eyes:     Conjunctiva/sclera: Conjunctivae normal.     Pupils: Pupils are equal, round, and reactive to light.  Cardiovascular:     Rate and Rhythm: Normal rate and regular rhythm.     Heart sounds: Normal heart sounds.  Pulmonary:     Effort: Pulmonary effort is normal.     Breath sounds: Normal breath sounds.  Musculoskeletal:     Cervical back: Neck supple.     Thoracic back: Normal.     Lumbar back: No swelling, edema, deformity, signs of trauma, lacerations, spasms, tenderness or bony tenderness. Normal range of motion. Positive right straight leg raise test. Negative left straight leg raise test. No scoliosis.     Right hip: Tenderness present. No deformity, lacerations, bony tenderness or crepitus. Normal range of motion. Normal strength.     Right upper leg: Normal.     Right lower leg: No edema.     Left lower leg: No edema.       Legs:  Skin:    General: Skin is warm and dry.     Capillary Refill: Capillary refill takes less than 2 seconds.  Neurological:      General: No focal deficit present.     Mental Status: He is alert and oriented to person, place, and time.  Psychiatric:        Mood and Affect: Mood normal.        Behavior: Behavior normal.        Thought Content: Thought content normal.        Judgment: Judgment normal.    Physical Exam   MUSCULOSKELETAL:  Tenderness upon palpation at the belt line on the right hip. Pain elicited upon bending the knee and applying pressure. Mild sensitivity upon palpation at the belt line.        Results for orders placed or performed in visit on 09/02/23  Bayer DCA Hb A1c Waived   Collection Time: 09/02/23 10:28 AM  Result Value Ref Range   HB A1C (BAYER DCA - WAIVED) 5.7 (H) 4.8 - 5.6 %  CBC with Differential/Platelet   Collection Time: 09/02/23 10:29 AM  Result Value Ref Range   WBC 8.1 3.4 - 10.8 x10E3/uL   RBC 5.45 4.14 - 5.80 x10E6/uL   Hemoglobin 16.6 13.0 - 17.7 g/dL   Hematocrit 08.6 57.8 - 51.0 %   MCV 92 79 - 97 fL   MCH 30.5 26.6 - 33.0 pg   MCHC 33.1 31.5 - 35.7 g/dL   RDW 46.9 62.9 - 52.8 %   Platelets 284 150 - 450 x10E3/uL   Neutrophils 56 Not Estab. %   Lymphs 31 Not Estab. %   Monocytes 7 Not Estab. %   Eos 5 Not Estab. %   Basos 1 Not Estab. %   Neutrophils Absolute 4.5 1.4 - 7.0 x10E3/uL   Lymphocytes Absolute 2.5 0.7 - 3.1 x10E3/uL   Monocytes Absolute 0.6 0.1 - 0.9 x10E3/uL   EOS (ABSOLUTE) 0.4 0.0 - 0.4 x10E3/uL   Basophils Absolute 0.1 0.0 - 0.2 x10E3/uL   Immature Granulocytes 0 Not Estab. %   Immature Grans (Abs) 0.0 0.0 - 0.1 x10E3/uL  CMP14+EGFR   Collection Time: 09/02/23 10:29 AM  Result Value Ref Range   Glucose 113 (H) 70 - 99 mg/dL   BUN 19 6 - 24 mg/dL   Creatinine, Ser 4.13 0.76 - 1.27 mg/dL   eGFR 93 >24 MW/NUU/7.25   BUN/Creatinine Ratio 19 9 - 20   Sodium 143 134 - 144 mmol/L   Potassium 5.0 3.5 - 5.2 mmol/L   Chloride 104 96 - 106 mmol/L   CO2 25 20 - 29 mmol/L   Calcium 9.8 8.7 - 10.2 mg/dL   Total Protein 6.9 6.0 - 8.5 g/dL    Albumin 4.4 4.1 - 5.1 g/dL   Globulin, Total 2.5 1.5 - 4.5 g/dL   Bilirubin Total 0.3 0.0 - 1.2 mg/dL   Alkaline Phosphatase 115 44 - 121 IU/L   AST 28 0 - 40 IU/L   ALT 43 0 - 44 IU/L  Lipid panel   Collection Time: 09/02/23 10:29 AM  Result Value Ref Range   Cholesterol, Total 178 100 - 199 mg/dL   Triglycerides 366 0 - 149 mg/dL   HDL 36 (L) >44 mg/dL   VLDL Cholesterol Cal 20 5 - 40 mg/dL   LDL Chol Calc (NIH) 034 (H) 0 - 99 mg/dL   Chol/HDL Ratio 4.9 0.0 - 5.0 ratio  Thyroid Panel With TSH   Collection Time: 09/02/23 10:29 AM  Result Value Ref Range   TSH 0.690 0.450 - 4.500 uIU/mL   T4, Total 9.0 4.5 - 12.0 ug/dL   T3 Uptake Ratio 21 (L) 24 - 39 %   Free Thyroxine Index 1.9 1.2 - 4.9     X-Ray: right hip: arthritic changes. No acute findings. Preliminary x-ray reading by Kari Baars, FNP-C, [facility].   Pertinent labs & imaging results that were available during my care of the patient were reviewed by me and considered in my medical decision making.  Assessment & Plan:  Gumecindo was seen today for groin  pain.  Diagnoses and all orders for this visit:  Right groin pain -     Cancel: Urinalysis, Routine w reflex microscopic -     predniSONE (DELTASONE) 20 MG tablet; Take 2 tablets (40 mg total) by mouth daily with breakfast for 5 days. -     DG Hip Unilat W OR W/O Pelvis 2-3 Views Right; Future -     cyclobenzaprine (FLEXERIL) 10 MG tablet; Take 1 tablet (10 mg total) by mouth 3 (three) times daily as needed for muscle spasms. -     methylPREDNISolone acetate (DEPO-MEDROL) injection 80 mg  Right sided sciatica -     predniSONE (DELTASONE) 20 MG tablet; Take 2 tablets (40 mg total) by mouth daily with breakfast for 5 days. -     DG Hip Unilat W OR W/O Pelvis 2-3 Views Right; Future -     cyclobenzaprine (FLEXERIL) 10 MG tablet; Take 1 tablet (10 mg total) by mouth 3 (three) times daily as needed for muscle spasms. -     methylPREDNISolone acetate (DEPO-MEDROL)  injection 80 mg     Assessment and Plan    Sciatica Sciatica with pain radiating from the belt line down the front of the leg to the top of the foot. Symptoms started a couple of weeks ago, exacerbated by sitting, and relieved by standing. Pain is described as a dull throb with shooting sensations. No relief from ice packs, heating pads, Biofreeze, hot showers, Motrin, or Tylenol. History of back surgery 25 years ago, but no current back pain. Recent injury at Christmas may be related. Pain is severe enough to interfere with sleep and daily activities. Discussed the risks and benefits of steroid treatment, including potential gastrointestinal side effects, and the need to avoid aspirin-containing products. Prefers to avoid pain pills due to adverse effects and will use Flexeril only as needed. - Administer steroid injection - Prescribe oral prednisone starting tomorrow - Order right hip x-ray - Prescribe Flexeril as needed for muscle relaxation, to be taken at home in the evening when not driving  General Health Maintenance Discussed the importance of avoiding overuse of aspirin-containing products like Goody powders due to potential gastrointestinal side effects. - Advise against the use of Goody powders due to aspirin content and potential stomach issues  Follow-up - Follow up after hip x-ray results and initial response to steroid treatment.          Continue all other maintenance medications.  Follow up plan: Return if symptoms worsen or fail to improve.   Continue healthy lifestyle choices, including diet (rich in fruits, vegetables, and lean proteins, and low in salt and simple carbohydrates) and exercise (at least 30 minutes of moderate physical activity daily).  Educational handout given for sciatica   The above assessment and management plan was discussed with the patient. The patient verbalized understanding of and has agreed to the management plan. Patient is aware to  call the clinic if they develop any new symptoms or if symptoms persist or worsen. Patient is aware when to return to the clinic for a follow-up visit. Patient educated on when it is appropriate to go to the emergency department.   Kari Baars, FNP-C [facility] [phone]

## 2024-01-02 ENCOUNTER — Encounter: Payer: Self-pay | Admitting: Family Medicine

## 2024-01-08 ENCOUNTER — Other Ambulatory Visit (HOSPITAL_COMMUNITY): Payer: Self-pay

## 2024-01-12 ENCOUNTER — Other Ambulatory Visit: Payer: Self-pay | Admitting: Family Medicine

## 2024-01-12 ENCOUNTER — Encounter: Payer: Self-pay | Admitting: Family Medicine

## 2024-01-12 DIAGNOSIS — M5431 Sciatica, right side: Secondary | ICD-10-CM

## 2024-01-12 MED ORDER — PREDNISONE 20 MG PO TABS: 40.0000 mg | ORAL_TABLET | Freq: Every day | ORAL | 0 refills | Status: AC

## 2024-01-14 ENCOUNTER — Encounter (HOSPITAL_COMMUNITY): Payer: Self-pay

## 2024-01-15 ENCOUNTER — Other Ambulatory Visit (HOSPITAL_COMMUNITY): Payer: Self-pay

## 2024-01-17 ENCOUNTER — Other Ambulatory Visit (HOSPITAL_COMMUNITY): Payer: Self-pay

## 2024-01-17 ENCOUNTER — Other Ambulatory Visit: Payer: Self-pay

## 2024-04-18 ENCOUNTER — Other Ambulatory Visit (HOSPITAL_COMMUNITY): Payer: Self-pay

## 2024-04-24 ENCOUNTER — Encounter (HOSPITAL_COMMUNITY): Payer: Self-pay

## 2024-04-24 ENCOUNTER — Other Ambulatory Visit (HOSPITAL_COMMUNITY): Payer: Self-pay

## 2024-04-24 ENCOUNTER — Other Ambulatory Visit: Payer: Self-pay

## 2024-04-28 ENCOUNTER — Other Ambulatory Visit (HOSPITAL_COMMUNITY): Payer: Self-pay

## 2024-06-21 ENCOUNTER — Encounter: Payer: Self-pay | Admitting: Family Medicine

## 2024-06-21 ENCOUNTER — Ambulatory Visit: Admitting: Family Medicine

## 2024-06-21 ENCOUNTER — Ambulatory Visit: Payer: Self-pay | Admitting: Family Medicine

## 2024-06-21 VITALS — BP 134/82 | HR 87 | Temp 97.0°F | Ht 74.0 in | Wt 264.6 lb

## 2024-06-21 DIAGNOSIS — Z6833 Body mass index (BMI) 33.0-33.9, adult: Secondary | ICD-10-CM

## 2024-06-21 DIAGNOSIS — Z0000 Encounter for general adult medical examination without abnormal findings: Secondary | ICD-10-CM | POA: Diagnosis not present

## 2024-06-21 DIAGNOSIS — K219 Gastro-esophageal reflux disease without esophagitis: Secondary | ICD-10-CM

## 2024-06-21 DIAGNOSIS — Z0001 Encounter for general adult medical examination with abnormal findings: Secondary | ICD-10-CM

## 2024-06-21 DIAGNOSIS — E559 Vitamin D deficiency, unspecified: Secondary | ICD-10-CM | POA: Diagnosis not present

## 2024-06-21 DIAGNOSIS — R0789 Other chest pain: Secondary | ICD-10-CM | POA: Diagnosis not present

## 2024-06-21 DIAGNOSIS — R202 Paresthesia of skin: Secondary | ICD-10-CM | POA: Diagnosis not present

## 2024-06-21 DIAGNOSIS — Z1211 Encounter for screening for malignant neoplasm of colon: Secondary | ICD-10-CM

## 2024-06-21 DIAGNOSIS — I10 Essential (primary) hypertension: Secondary | ICD-10-CM | POA: Diagnosis not present

## 2024-06-21 DIAGNOSIS — K921 Melena: Secondary | ICD-10-CM | POA: Diagnosis not present

## 2024-06-21 DIAGNOSIS — Z1212 Encounter for screening for malignant neoplasm of rectum: Secondary | ICD-10-CM

## 2024-06-21 DIAGNOSIS — R209 Unspecified disturbances of skin sensation: Secondary | ICD-10-CM

## 2024-06-21 DIAGNOSIS — M79603 Pain in arm, unspecified: Secondary | ICD-10-CM | POA: Diagnosis not present

## 2024-06-21 LAB — LIPID PANEL

## 2024-06-21 LAB — BAYER DCA HB A1C WAIVED: HB A1C (BAYER DCA - WAIVED): 5.7 % — ABNORMAL HIGH (ref 4.8–5.6)

## 2024-06-21 MED ORDER — PANTOPRAZOLE SODIUM 40 MG PO TBEC: 40.0000 mg | DELAYED_RELEASE_TABLET | Freq: Every day | ORAL | 1 refills | Status: DC

## 2024-06-21 MED ORDER — AMLODIPINE BESYLATE 5 MG PO TABS
5.0000 mg | ORAL_TABLET | Freq: Every day | ORAL | 1 refills | Status: DC
Start: 2024-06-21 — End: 2024-07-19

## 2024-06-21 NOTE — Patient Instructions (Signed)

## 2024-06-21 NOTE — Progress Notes (Signed)
 Complete physical exam  Patient: John Matthews   DOB: 10/02/1971   53 y.o. Male  MRN: [id number]  Subjective:    Chief Complaint  Patient presents with   Annual Exam    John Matthews is a 53 y.o. male who presents today for a complete physical exam. He reports consuming a general diet. The patient has a physically strenuous job, but has no regular exercise apart from work.  He generally feels well. He reports sleeping well. He does have additional problems to discuss today.   He reports chest pressure that typically lasts all day. States this is due to stress and his job. No changes from prior incidents of chest pressure, NM Stress test was unremarkable in the past. Nothing makes the pain worse or better. No other associated symptoms. States he has noticed some bright red blood in his stool. States he started taking Goody Powders again, and this is when he noticed the blood. He states at times he will notice his left arm with get cold feeling and look a different color from his right. States the arm will throb when this happens.   Most recent fall risk assessment:    06/21/2024    3:17 PM  Fall Risk   Falls in the past year? 0     Most recent depression screenings:    06/21/2024    3:17 PM 06/22/2023    3:49 PM  PHQ 2/9 Scores  PHQ - 2 Score 0 0  PHQ- 9 Score  1    Vision:Within last year and Dental: No current dental problems  Patient Active Problem List   Diagnosis Date Noted   GERD (gastroesophageal reflux disease) 06/21/2024   Elevated blood pressure 11/17/2011   Tobacco abuse 11/17/2011   Family history of aortic aneurysm 11/17/2011   Past Medical History:  Diagnosis Date   Generalized headaches    Hypertension    URI (upper respiratory infection)    Past Surgical History:  Procedure Laterality Date   BACK SURGERY  2003   Social History   Tobacco Use   Smoking status: Every Day    Current packs/day: 1.00    Types: Cigarettes  Substance Use  Topics   Alcohol use: Yes    Comment: Occasional   Drug use: No   Social History   Socioeconomic History   Marital status: Married    Spouse name: Not on file   Number of children: Not on file   Years of education: 12th grade   Highest education level: Not on file  Occupational History    Employer: [employer]  Tobacco Use   Smoking status: Every Day    Current packs/day: 1.00    Types: Cigarettes   Smokeless tobacco: Not on file  Substance and Sexual Activity   Alcohol use: Yes    Comment: Occasional   Drug use: No   Sexual activity: Yes  Other Topics Concern   Not on file  Social History Narrative   Not on file   Social Drivers of Health   Financial Resource Strain: Not on file  Food Insecurity: Not on file  Transportation Needs: Not on file  Physical Activity: Not on file  Stress: Not on file  Social Connections: Not on file  Intimate Partner Violence: Not on file   Family Status  Relation Name Status   Mother  Alive   Father  Deceased   Sister  Alive   Brother  Alive   MGF  (  Not Specified)   PGM  (Not Specified)   Other  (Not Specified)  No partnership data on file   Family History  Problem Relation Age of Onset   Heart disease Father    Heart attack Father 15       Deceased   Aneurysm Father    Hypertension Sister        Alive   Heart disease Maternal Grandfather 21       Deceased   Heart disease Paternal Grandmother 47       Deceased   Diabetes Other    No Known Allergies    Patient Care Team: Hayes Czaja, Rock HERO, FNP as PCP - General (Family Medicine) Debera Jayson MATSU, MD as PCP - Cardiology (Cardiology)   Outpatient Medications Prior to Visit  Medication Sig   [DISCONTINUED] cetirizine  (ZYRTEC ) 10 MG tablet Take 1 tablet (10 mg total) by mouth daily.   [DISCONTINUED] cyclobenzaprine  (FLEXERIL ) 10 MG tablet Take 1 tablet (10 mg total) by mouth 3 (three) times daily as needed for muscle spasms.   [DISCONTINUED] fluticasone  (FLONASE )  50 MCG/ACT nasal spray Place 2 sprays into both nostrils daily.   [DISCONTINUED] meclizine  (ANTIVERT ) 25 MG tablet Take 1 tablet (25 mg total) by mouth 3 (three) times daily as needed for dizziness.   [DISCONTINUED] ondansetron  (ZOFRAN ) 4 MG tablet Take 1 tablet (4 mg total) by mouth every 8 (eight) hours as needed for nausea or vomiting.   [DISCONTINUED] pantoprazole  (PROTONIX ) 40 MG tablet Take 1 tablet (40 mg total) by mouth daily.   No facility-administered medications prior to visit.    Review of Systems  Cardiovascular:  Positive for chest pain.  Gastrointestinal:  Positive for blood in stool.  Musculoskeletal:  Positive for myalgias.  All other systems reviewed and are negative.         Objective:     BP 134/82   Pulse 87   Temp (!) 97 F (36.1 C)   Ht 6' 2 (1.88 m)   Wt 264 lb 9.6 oz (120 kg)   SpO2 96%   BMI 33.97 kg/m  BP Readings from Last 3 Encounters:  06/21/24 134/82  12/16/23 137/84  09/02/23 131/83   Wt Readings from Last 3 Encounters:  06/21/24 264 lb 9.6 oz (120 kg)  12/16/23 265 lb (120.2 kg)  09/02/23 262 lb (118.8 kg)   SpO2 Readings from Last 3 Encounters:  06/21/24 96%  12/16/23 95%  09/02/23 96%      Physical Exam Vitals and nursing note reviewed.  Constitutional:      General: He is not in acute distress.    Appearance: Normal appearance. He is well-developed and well-groomed. He is obese. He is not ill-appearing, toxic-appearing or diaphoretic.  HENT:     Head: Normocephalic and atraumatic.     Jaw: There is normal jaw occlusion.     Right Ear: Hearing, tympanic membrane, ear canal and external ear normal.     Left Ear: Hearing, tympanic membrane, ear canal and external ear normal.     Nose: Nose normal.     Mouth/Throat:     Lips: Pink.     Mouth: Mucous membranes are moist.     Pharynx: Oropharynx is clear. Uvula midline.  Eyes:     General: Lids are normal.     Extraocular Movements: Extraocular movements intact.      Conjunctiva/sclera: Conjunctivae normal.     Pupils: Pupils are equal, round, and reactive to light.  Neck:  Thyroid : No thyroid  mass, thyromegaly or thyroid  tenderness.     Vascular: No carotid bruit or JVD.     Trachea: Trachea and phonation normal.  Cardiovascular:     Rate and Rhythm: Normal rate and regular rhythm.     Chest Wall: PMI is not displaced.     Pulses: Normal pulses.          Carotid pulses are 2+ on the right side and 2+ on the left side.      Radial pulses are 2+ on the right side and 2+ on the left side.       Femoral pulses are 2+ on the right side and 2+ on the left side.      Popliteal pulses are 2+ on the right side and 2+ on the left side.       Dorsalis pedis pulses are 2+ on the right side and 2+ on the left side.       Posterior tibial pulses are 2+ on the right side and 2+ on the left side.     Heart sounds: Normal heart sounds. No murmur heard.    No friction rub. No gallop.  Pulmonary:     Effort: Pulmonary effort is normal. No respiratory distress.     Breath sounds: Normal breath sounds. No wheezing.  Abdominal:     General: Bowel sounds are normal. There is no distension or abdominal bruit.     Palpations: Abdomen is soft. There is no hepatomegaly or splenomegaly.     Tenderness: There is no abdominal tenderness. There is no right CVA tenderness or left CVA tenderness.     Hernia: No hernia is present.  Musculoskeletal:        General: Normal range of motion.     Cervical back: Normal range of motion and neck supple.     Right lower leg: No edema.     Left lower leg: No edema.  Lymphadenopathy:     Cervical: No cervical adenopathy.  Skin:    General: Skin is warm and dry.     Capillary Refill: Capillary refill takes less than 2 seconds.     Coloration: Skin is not cyanotic, jaundiced or pale.     Findings: No rash.  Neurological:     General: No focal deficit present.     Mental Status: He is alert and oriented to person, place, and time.      Sensory: Sensation is intact.     Motor: Motor function is intact.     Coordination: Coordination is intact.     Gait: Gait is intact.     Deep Tendon Reflexes: Reflexes are normal and symmetric.  Psychiatric:        Attention and Perception: Attention and perception normal.        Mood and Affect: Mood and affect normal.        Speech: Speech normal.        Behavior: Behavior normal. Behavior is cooperative.        Thought Content: Thought content normal.        Cognition and Memory: Cognition and memory normal.        Judgment: Judgment normal.     EKG: SR 82, PR 156 ms, QT 358 ms. No acute ST-T changes, no changes from prior EKG.   Last CBC Lab Results  Component Value Date   WBC 8.1 09/02/2023   HGB 16.6 09/02/2023   HCT 50.1 09/02/2023   MCV 92 09/02/2023  MCH 30.5 09/02/2023   RDW 12.5 09/02/2023   PLT 284 09/02/2023   Last metabolic panel Lab Results  Component Value Date   GLUCOSE 113 (H) 09/02/2023   NA 143 09/02/2023   K 5.0 09/02/2023   CL 104 09/02/2023   CO2 25 09/02/2023   BUN 19 09/02/2023   CREATININE 0.99 09/02/2023   EGFR 93 09/02/2023   CALCIUM  9.8 09/02/2023   PROT 6.9 09/02/2023   ALBUMIN 4.4 09/02/2023   LABGLOB 2.5 09/02/2023   AGRATIO 1.8 06/30/2022   BILITOT 0.3 09/02/2023   ALKPHOS 115 09/02/2023   AST 28 09/02/2023   ALT 43 09/02/2023   ANIONGAP 12 08/31/2023   Last lipids Lab Results  Component Value Date   CHOL 178 09/02/2023   HDL 36 (L) 09/02/2023   LDLCALC 122 (H) 09/02/2023   TRIG 109 09/02/2023   CHOLHDL 4.9 09/02/2023   Last hemoglobin A1c Lab Results  Component Value Date   HGBA1C 5.7 (H) 09/02/2023   Last thyroid  functions Lab Results  Component Value Date   TSH 0.690 09/02/2023   T4TOTAL 9.0 09/02/2023      Assessment & Plan:    Routine Health Maintenance and Physical Exam   Health Maintenance  Topic Date Due   Colonoscopy  Never done   Zoster Vaccines- Shingrix (1 of 2) 09/21/2024 (Originally  08/29/1992)   INFLUENZA VACCINE  01/30/2025 (Originally 06/02/2024)   DTaP/Tdap/Td (1 - Tdap) 06/21/2025 (Originally 08/29/1992)   Pneumococcal Vaccine: 50+ Years (1 of 2 - PCV) 06/21/2025 (Originally 08/29/1992)   Hepatitis B Vaccines 19-59 Average Risk (1 of 3 - 19+ 3-dose series) 06/21/2025 (Originally 08/29/1992)   Hepatitis C Screening  Completed   HIV Screening  Completed   HPV VACCINES  Aged Out   Meningococcal B Vaccine  Aged Out   COVID-19 Vaccine  Discontinued    Discussed health benefits of physical activity, and encouraged him to engage in regular exercise appropriate for his age and condition.  Problem List Items Addressed This Visit       Digestive   GERD (gastroesophageal reflux disease)   Relevant Medications   pantoprazole  (PROTONIX ) 40 MG tablet   Other Relevant Orders   CBC with Differential/Platelet   Other Visit Diagnoses       Annual physical exam    -  Primary   Relevant Orders   CBC with Differential/Platelet   CMP14+EGFR   Lipid panel     Vitamin D  deficiency       Relevant Orders   CMP14+EGFR   Vitamin D , 25-hydroxy     Chest pressure     No acute changes on EKG, no other associated symptoms. No prior ischemic changes on stress testing. Will add amlodipine  for antianginal properties and antihypertensive properties. Pt aware of red flags that need emergent evaluation.    Relevant Medications   amLODipine  (NORVASC ) 5 MG tablet   Other Relevant Orders   EKG 12-Lead (Completed)     Arm paresthesia, left     US  ordered   Relevant Orders   CBC with Differential/Platelet   CMP14+EGFR   Thyroid  Panel With TSH     Painful and cold upper extremity    US  ordered    Relevant Orders   CBC with Differential/Platelet   CMP14+EGFR   Thyroid  Panel With TSH     Primary hypertension     DASH diet and exercise encouraged. Will start below for dual benefit, antianginal and antihypertensive. BP and BMP check in 2 weeks.  Relevant Medications   amLODipine   (NORVASC ) 5 MG tablet   Other Relevant Orders   CBC with Differential/Platelet   CMP14+EGFR   Lipid panel   Thyroid  Panel With TSH     BMI 33.0-33.9,adult       Relevant Orders   Bayer DCA Hb A1c Waived   CBC with Differential/Platelet   CMP14+EGFR   Lipid panel   Thyroid  Panel With TSH   Vitamin D , 25-hydroxy     Screening for colorectal cancer       Relevant Orders   Cologuard     Blood in stool     Declined referral to GI. Aware of red flags. Stop use of goody powders    Relevant Orders   CBC with Differential/Platelet      Return in about 2 weeks (around 07/05/2024) for nurse visit for BP and BMP.     Rosaline Bruns, FNP

## 2024-06-22 LAB — LIPID PANEL
Cholesterol, Total: 182 mg/dL (ref 100–199)
HDL: 35 mg/dL — AB (ref >39)
LDL CALC COMMENT:: 5.2 ratio — AB (ref 0.0–5.0)
LDL Chol Calc (NIH): 116 mg/dL — AB (ref 0–99)
Triglycerides: 173 mg/dL — AB (ref 0–149)
VLDL Cholesterol Cal: 31 mg/dL (ref 5–40)

## 2024-06-22 LAB — CBC WITH DIFFERENTIAL/PLATELET
Basophils Absolute: 0.1 x10E3/uL (ref 0.0–0.2)
Basos: 1 %
EOS (ABSOLUTE): 0.4 x10E3/uL (ref 0.0–0.4)
Eos: 4 %
Hematocrit: 47.8 % (ref 37.5–51.0)
Hemoglobin: 15.7 g/dL (ref 13.0–17.7)
Immature Grans (Abs): 0 x10E3/uL (ref 0.0–0.1)
Immature Granulocytes: 0 %
Lymphocytes Absolute: 3.1 x10E3/uL (ref 0.7–3.1)
Lymphs: 31 %
MCH: 30.5 pg (ref 26.6–33.0)
MCHC: 32.8 g/dL (ref 31.5–35.7)
MCV: 93 fL (ref 79–97)
Monocytes Absolute: 0.8 x10E3/uL (ref 0.1–0.9)
Monocytes: 8 %
Neutrophils Absolute: 5.5 x10E3/uL (ref 1.4–7.0)
Neutrophils: 56 %
Platelets: 268 x10E3/uL (ref 150–450)
RBC: 5.14 x10E6/uL (ref 4.14–5.80)
RDW: 12.8 % (ref 11.6–15.4)
WBC: 9.8 x10E3/uL (ref 3.4–10.8)

## 2024-06-22 LAB — CMP14+EGFR
ALT: 37 IU/L (ref 0–44)
AST: 25 IU/L (ref 0–40)
Albumin: 4.4 g/dL (ref 4.1–5.1)
Alkaline Phosphatase: 106 IU/L (ref 44–121)
BUN/Creatinine Ratio: 17 (ref 9–20)
BUN: 18 mg/dL (ref 6–24)
Bilirubin Total: 0.3 mg/dL (ref 0.0–1.2)
CO2: 22 mmol/L (ref 20–29)
Calcium: 9.8 mg/dL (ref 8.7–10.2)
Chloride: 101 mmol/L (ref 96–106)
Creatinine, Ser: 1.09 mg/dL (ref 0.76–1.27)
Globulin, Total: 2.6 g/dL (ref 1.5–4.5)
Glucose: 78 mg/dL (ref 70–99)
Potassium: 5 mmol/L (ref 3.5–5.2)
Sodium: 138 mmol/L (ref 134–144)
Total Protein: 7 g/dL (ref 6.0–8.5)
eGFR: 83 mL/min/1.73 (ref >59)

## 2024-06-22 LAB — VITAMIN D 25 HYDROXY (VIT D DEFICIENCY, FRACTURES): Vit D, 25-Hydroxy: 48.4 ng/mL (ref 30.0–100.0)

## 2024-06-22 LAB — THYROID PANEL WITH TSH
Free Thyroxine Index: 2.4 (ref 1.2–4.9)
T3 Uptake Ratio: 23 — AB (ref 24–39)
T4, Total: 10.3 ug/dL (ref 4.5–12.0)
TSH: 1.16 u[IU]/mL (ref 0.450–4.500)

## 2024-07-18 ENCOUNTER — Encounter: Payer: Self-pay | Admitting: Family Medicine

## 2024-07-19 ENCOUNTER — Other Ambulatory Visit (HOSPITAL_COMMUNITY): Payer: Self-pay

## 2024-07-19 ENCOUNTER — Other Ambulatory Visit: Payer: Self-pay

## 2024-07-19 DIAGNOSIS — K219 Gastro-esophageal reflux disease without esophagitis: Secondary | ICD-10-CM

## 2024-07-19 DIAGNOSIS — R0789 Other chest pain: Secondary | ICD-10-CM

## 2024-07-19 DIAGNOSIS — I10 Essential (primary) hypertension: Secondary | ICD-10-CM

## 2024-07-19 MED ORDER — AMLODIPINE BESYLATE 5 MG PO TABS
5.0000 mg | ORAL_TABLET | Freq: Every day | ORAL | 1 refills | Status: AC
  Filled 2024-07-19: qty 90, 90d supply, fill #0
  Filled 2024-10-20 – 2024-10-23 (×2): qty 90, 90d supply, fill #1

## 2024-07-19 MED ORDER — PANTOPRAZOLE SODIUM 40 MG PO TBEC
40.0000 mg | DELAYED_RELEASE_TABLET | Freq: Every day | ORAL | 1 refills | Status: DC
  Filled 2024-07-19: qty 90, 90d supply, fill #0

## 2024-08-25 ENCOUNTER — Other Ambulatory Visit: Payer: Self-pay

## 2024-08-25 ENCOUNTER — Other Ambulatory Visit (HOSPITAL_COMMUNITY): Payer: Self-pay

## 2024-08-25 ENCOUNTER — Other Ambulatory Visit: Payer: Self-pay | Admitting: Family Medicine

## 2024-08-25 DIAGNOSIS — K219 Gastro-esophageal reflux disease without esophagitis: Secondary | ICD-10-CM

## 2024-08-25 MED ORDER — PANTOPRAZOLE SODIUM 40 MG PO TBEC
40.0000 mg | DELAYED_RELEASE_TABLET | Freq: Every day | ORAL | 1 refills | Status: AC
  Filled 2024-08-25: qty 90, 90d supply, fill #0
  Filled 2024-12-04: qty 90, 90d supply, fill #1

## 2024-09-20 NOTE — Addendum Note (Signed)
 Addended by: SEVERA ROCK HERO on: 09/20/2024 11:12 AM   Modules accepted: Orders

## 2024-09-21 ENCOUNTER — Encounter (INDEPENDENT_AMBULATORY_CARE_PROVIDER_SITE_OTHER): Payer: Self-pay | Admitting: *Deleted

## 2024-10-12 ENCOUNTER — Other Ambulatory Visit: Payer: Self-pay | Admitting: Family Medicine

## 2024-10-12 DIAGNOSIS — R6882 Decreased libido: Secondary | ICD-10-CM

## 2024-10-20 ENCOUNTER — Other Ambulatory Visit (HOSPITAL_COMMUNITY): Payer: Self-pay

## 2024-10-23 ENCOUNTER — Other Ambulatory Visit (HOSPITAL_COMMUNITY): Payer: Self-pay

## 2024-12-04 ENCOUNTER — Other Ambulatory Visit (HOSPITAL_COMMUNITY): Payer: Self-pay
